# Patient Record
Sex: Male | Born: 1995 | Race: Black or African American | Hispanic: No | Marital: Single | Smoking: Never smoker
Health system: Southern US, Community
[De-identification: ages and names within clinical notes are randomized; demographics above are authoritative.]

## PROBLEM LIST (undated history)

## (undated) HISTORY — PX: HAND SURGERY: SHX662

---
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
## 2019-03-15 ENCOUNTER — Encounter (HOSPITAL_COMMUNITY): Payer: Self-pay

## 2019-03-15 ENCOUNTER — Ambulatory Visit (HOSPITAL_COMMUNITY)
Admission: EM | Admit: 2019-03-15 | Discharge: 2019-03-15 | Disposition: A | Discharge status: Home / Self Care | Payer: Self-pay | Attending: Internal Medicine | Admitting: Internal Medicine

## 2019-03-15 ENCOUNTER — Other Ambulatory Visit: Payer: Self-pay

## 2019-03-15 DIAGNOSIS — R05 Cough: Secondary | ICD-10-CM | POA: Insufficient documentation

## 2019-03-15 DIAGNOSIS — J00 Acute nasopharyngitis [common cold]: Secondary | ICD-10-CM | POA: Insufficient documentation

## 2019-03-15 DIAGNOSIS — Z20828 Contact with and (suspected) exposure to other viral communicable diseases: Secondary | ICD-10-CM | POA: Insufficient documentation

## 2019-03-15 LAB — POCT RAPID STREP A: Streptococcus, Group A Screen (Direct): NEGATIVE

## 2019-03-15 NOTE — ED Provider Notes (Signed)
[facility]    CSN: [id number] Arrival date & time: 03/15/19  0840      History   Chief Complaint Chief Complaint  Patient presents with  . Sore Throat  . Cough    HPI Charles Atkinson is a 23 y.o. male with no past medical history comes to urgent care with complaints of 1-day history of sore throat and nonproductive cough.  Patient attended homecoming party with a large number of people sometime last week.  He denies knowledge of any sick contacts.  He started experiencing symptoms yesterday.  He denies any fever or chills.  No body aches.  He has not tried any over-the-counter medications.  He denies shortness of breath, loss of taste or smell.  No nausea, vomiting or diarrhea  HPI  History reviewed. No pertinent past medical history.  There are no active problems to display for this patient.   Past Surgical History:  Procedure Laterality Date  . HAND SURGERY Right        Home Medications    Prior to Admission medications   Not on File    Family History History reviewed. No pertinent family history.  Social History Social History   Tobacco Use  . Smoking status: Never Smoker  . Smokeless tobacco: Never Used  Substance Use Topics  . Alcohol use: No  . Drug use: No     Allergies   Patient has no known allergies.   Review of Systems Review of Systems  Constitutional: Negative.   HENT: Positive for congestion and sore throat. Negative for ear discharge, ear pain, facial swelling and mouth sores.   Respiratory: Positive for cough. Negative for chest tightness and shortness of breath.   Gastrointestinal: Negative.   Genitourinary: Negative.   Musculoskeletal: Negative.   Neurological: Negative for dizziness, weakness and light-headedness.     Physical Exam Triage Vital Signs ED Triage Vitals  Enc Vitals Group     BP 03/15/19 0912 133/64     Pulse Rate 03/15/19 0912 89     Resp 03/15/19 0912 17     Temp 03/15/19 0912 99.4 F (37.4  C)     Temp Source 03/15/19 0912 Oral     SpO2 03/15/19 0912 99 %     Weight --      Height --      Head Circumference --      Peak Flow --      Pain Score 03/15/19 0911 10     Pain Loc --      Pain Edu? --      Excl. in GC? --    No data found.  Updated Vital Signs BP 133/64   Pulse 89   Temp 99.4 F (37.4 C) (Oral)   Resp 17   SpO2 99%   Visual Acuity Right Eye Distance:   Left Eye Distance:   Bilateral Distance:    Right Eye Near:   Left Eye Near:    Bilateral Near:     Physical Exam Constitutional:      General: He is not in acute distress.    Appearance: He is not ill-appearing.  HENT:     Right Ear: Tympanic membrane normal. No drainage.     Left Ear: Tympanic membrane normal. No drainage.     Mouth/Throat:     Mouth: Mucous membranes are moist. No oral lesions.     Pharynx: Uvula midline. No pharyngeal swelling, oropharyngeal exudate or posterior oropharyngeal erythema.  Tonsils: No tonsillar exudate or tonsillar abscesses. 0 on the right. 0 on the left.  Neck:     Musculoskeletal: Normal range of motion.     Thyroid: No thyromegaly.  Cardiovascular:     Rate and Rhythm: Normal rate and regular rhythm.  Pulmonary:     Effort: Pulmonary effort is normal.     Breath sounds: Normal breath sounds.  Lymphadenopathy:     Cervical: No cervical adenopathy.  Skin:    General: Skin is warm and dry.     Capillary Refill: Capillary refill takes less than 2 seconds.  Neurological:     Mental Status: He is alert.      UC Treatments / Results  Labs (all labs ordered are listed, but only abnormal results are displayed) Labs Reviewed  CULTURE, GROUP A STREP ([place])  NOVEL CORONAVIRUS, NAA (HOSP ORDER, SEND-OUT TO REF LAB; TAT 18-24 HRS)  POCT RAPID STREP A    EKG   Radiology Dg Chest Portable 1 View  Result Date: 03/16/2019 CLINICAL DATA:  Cough, sore throat EXAM: PORTABLE CHEST 1 VIEW COMPARISON:  None. FINDINGS: The heart size and mediastinal  contours are within normal limits. Left costophrenic angle as excluded from the field of view. Lungs are otherwise clear. The visualized skeletal structures are unremarkable. IMPRESSION: No acute cardiopulmonary findings. Electronically Signed   By: Davina Poke M.D.   On: 03/16/2019 14:17    Procedures Procedures (including critical care time)  Medications Ordered in UC Medications - No data to display  Initial Impression / Assessment and Plan / UC Course  I have reviewed the triage vital signs and the nursing notes.  Pertinent labs & imaging results that were available during my care of the patient were reviewed by me and considered in my medical decision making (see chart for details).     1.  Acute illness with sore throat: Rapid strep is negative COVID-19 samples have been taken Patient is advised to self isolate until COVID-19 test results are available Tylenol/Motrin for pain Warm salt water gargle If patient has worsening shortness of breath or other worsening symptoms he is advised to return to urgent care to be reevaluated. Final Clinical Impressions(s) / UC Diagnoses   Final diagnoses:  Acute nasopharyngitis   Discharge Instructions   None    ED Prescriptions    None     PDMP not reviewed this encounter.   Chase Picket, MD 03/16/19 2122

## 2019-03-15 NOTE — ED Triage Notes (Signed)
Pt present to the UC for strep test. Pt states having sore throat and cough x 1day.

## 2019-03-16 ENCOUNTER — Other Ambulatory Visit: Payer: Self-pay

## 2019-03-16 ENCOUNTER — Emergency Department (HOSPITAL_COMMUNITY): Payer: Self-pay

## 2019-03-16 ENCOUNTER — Emergency Department (HOSPITAL_COMMUNITY)
Admission: EM | Admit: 2019-03-16 | Discharge: 2019-03-16 | Disposition: A | Discharge status: Home / Self Care | Payer: Self-pay | Attending: Emergency Medicine | Admitting: Emergency Medicine

## 2019-03-16 DIAGNOSIS — R05 Cough: Secondary | ICD-10-CM | POA: Insufficient documentation

## 2019-03-16 DIAGNOSIS — J020 Streptococcal pharyngitis: Secondary | ICD-10-CM | POA: Insufficient documentation

## 2019-03-16 LAB — COMPREHENSIVE METABOLIC PANEL
ALT: 24 U/L (ref 0–44)
AST: 23 U/L (ref 15–41)
Albumin: 4.1 g/dL (ref 3.5–5.0)
Alkaline Phosphatase: 74 U/L (ref 38–126)
Anion gap: 10 (ref 5–15)
BUN: 10 mg/dL (ref 6–20)
CO2: 25 mmol/L (ref 22–32)
Calcium: 9.6 mg/dL (ref 8.9–10.3)
Chloride: 101 mmol/L (ref 98–111)
Creatinine, Ser: 1.27 mg/dL — ABNORMAL HIGH (ref 0.61–1.24)
GFR calc Af Amer: >60 mL/min (ref >60)
GFR calc non Af Amer: >60 mL/min (ref >60)
Glucose, Bld: 104 mg/dL — ABNORMAL HIGH (ref 70–99)
Potassium: 3.6 mmol/L (ref 3.5–5.1)
Sodium: 136 mmol/L (ref 135–145)
Total Bilirubin: 1 mg/dL (ref 0.3–1.2)
Total Protein: 8 g/dL (ref 6.5–8.1)

## 2019-03-16 LAB — URINALYSIS, ROUTINE W REFLEX MICROSCOPIC
Bilirubin Urine: NEGATIVE
Glucose, UA: NEGATIVE mg/dL
Hgb urine dipstick: NEGATIVE
Ketones, ur: 5 mg/dL — AB
Leukocytes,Ua: NEGATIVE
Nitrite: NEGATIVE
Protein, ur: 30 mg/dL — AB
Specific Gravity, Urine: 1.027 (ref 1.005–1.030)
pH: 6 (ref 5.0–8.0)

## 2019-03-16 LAB — CBC
HCT: 45.5 % (ref 39.0–52.0)
Hemoglobin: 15 g/dL (ref 13.0–17.0)
MCH: 27.5 pg (ref 26.0–34.0)
MCHC: 33 g/dL (ref 30.0–36.0)
MCV: 83.5 fL (ref 80.0–100.0)
Platelets: 231 10*3/uL (ref 150–400)
RBC: 5.45 MIL/uL (ref 4.22–5.81)
RDW: 12.3 % (ref 11.5–15.5)
WBC: 10.1 10*3/uL (ref 4.0–10.5)
nRBC: 0 % (ref 0.0–0.2)

## 2019-03-16 LAB — GROUP A STREP BY PCR: Group A Strep by PCR: DETECTED — AB

## 2019-03-16 LAB — LIPASE, BLOOD: Lipase: 16 U/L (ref 11–51)

## 2019-03-16 LAB — INFLUENZA PANEL BY PCR (TYPE A & B)
Influenza A By PCR: NEGATIVE
Influenza B By PCR: NEGATIVE

## 2019-03-16 MED ORDER — SODIUM CHLORIDE 0.9 % IV BOLUS
1000.0000 mL | Freq: Once | INTRAVENOUS | Status: AC
  Administered 2019-03-16: 1000 mL via INTRAVENOUS

## 2019-03-16 MED ORDER — DEXAMETHASONE SODIUM PHOSPHATE 10 MG/ML IJ SOLN
10.0000 mg | Freq: Once | INTRAMUSCULAR | Status: AC
  Administered 2019-03-16: 10 mg via INTRAVENOUS
  Filled 2019-03-16: qty 1

## 2019-03-16 MED ORDER — ACETAMINOPHEN 325 MG PO TABS
650.0000 mg | ORAL_TABLET | Freq: Once | ORAL | Status: DC
  Filled 2019-03-16: qty 2

## 2019-03-16 MED ORDER — ACETAMINOPHEN 160 MG/5ML PO SOLN
650.0000 mg | Freq: Once | ORAL | Status: AC
  Administered 2019-03-16: 650 mg via ORAL
  Filled 2019-03-16: qty 20.3

## 2019-03-16 MED ORDER — ONDANSETRON HCL 4 MG/2ML IJ SOLN
4.0000 mg | Freq: Once | INTRAMUSCULAR | Status: AC
  Administered 2019-03-16: 4 mg via INTRAVENOUS
  Filled 2019-03-16: qty 2

## 2019-03-16 MED ORDER — PENICILLIN G BENZATHINE & PROC 1200000 UNIT/2ML IM SUSP
1.2000 10*6.[IU] | Freq: Once | INTRAMUSCULAR | Status: AC
  Administered 2019-03-16: 16:00:00 1.2 10*6.[IU] via INTRAMUSCULAR
  Filled 2019-03-16: qty 2

## 2019-03-16 NOTE — ED Triage Notes (Signed)
Pt here for evaluation of sore throat since yesterday, then nausea with one episode of vomiting and chills since last night. Denies sick contacts.

## 2019-03-16 NOTE — Discharge Instructions (Addendum)

## 2019-03-16 NOTE — ED Provider Notes (Signed)
[facility] DEPART[facility] Provider Note   CSN: [id number] Arrival date & time: 03/16/19  1009     History   Chief Complaint Sore throat  HPI Charles Atkinson is a 23 y.o. male presents to emergency department today with chief complaint of sore throat and cough x2 days. He is reporting associated nausea, emesis and chills.  He had one episode of nonbloody nonbilious emesis.  He also has had several episodes of diarrhea today that he describes as loose brown stool.  Denies any blood in stool.  He went to urgent care yesterday and had a strep test and culture that were negative. Patient was also swabbed for Covid and the test is still in process.  Patient is reporting generalized body aches, nonproductive cough.  He has not taken any medications for his symptoms prior to arrival.  He does not have thermometer at home and has been unable to check his temperature but thinks he has had fever and chills.  He denies congestion, ear pain, trouble swallowing, voice change, chest pain, wheezing, shortness of breath, neck pain, urinary symptoms.   No past medical history on file.  There are no active problems to display for this patient.   Past Surgical History:  Procedure Laterality Date  . HAND SURGERY Right         Home Medications    Prior to Admission medications   Not on File    Family History No family history on file.  Social History Social History   Tobacco Use  . Smoking status: Never Smoker  . Smokeless tobacco: Never Used  Substance Use Topics  . Alcohol use: No  . Drug use: No     Allergies   Patient has no known allergies.   Review of Systems Review of Systems  Constitutional: Positive for chills and fever.  HENT: Positive for sore throat. Negative for congestion, ear pain, facial swelling, rhinorrhea, sinus pressure, sinus pain, trouble swallowing and voice change.   Eyes: Negative for pain, redness and itching.   Respiratory: Positive for cough. Negative for shortness of breath and wheezing.   Cardiovascular: Negative for chest pain and palpitations.  Gastrointestinal: Positive for abdominal pain, diarrhea, nausea and vomiting. Negative for blood in stool and constipation.  Genitourinary: Negative for dysuria.  Musculoskeletal: Negative for arthralgias, back pain, myalgias and neck pain.  Skin: Negative for rash and wound.  Neurological: Negative for dizziness, syncope, weakness, numbness and headaches.  Psychiatric/Behavioral: Negative for confusion.     Physical Exam Updated Vital Signs BP 126/81 (BP Location: Left Arm)   Pulse (!) 104   Temp 100 F (37.8 C) (Oral)   Resp 14   SpO2 98%   Physical Exam Vitals signs and nursing note reviewed.  Constitutional:      General: He is not in acute distress.    Appearance: He is ill-appearing. He is not toxic-appearing.  HENT:     Head: Normocephalic and atraumatic.     Comments: No sinus or temporal tenderness.      Right Ear: Tympanic membrane and external ear normal.     Left Ear: Tympanic membrane and external ear normal.     Nose: Nose normal.     Mouth/Throat:     Mouth: Mucous membranes are moist.     Pharynx: Oropharynx is clear.     Comments: Minor erythema to oropharynx, no edema, no exudate, no tonsillar swelling, voice normal, neck supple without lymphadenopathy  Eyes:  General: No scleral icterus.       Right eye: No discharge.        Left eye: No discharge.     Extraocular Movements: Extraocular movements intact.     Conjunctiva/sclera: Conjunctivae normal.     Pupils: Pupils are equal, round, and reactive to light.  Neck:     Musculoskeletal: Normal range of motion.     Vascular: No JVD.  Cardiovascular:     Rate and Rhythm: Normal rate and regular rhythm.     Pulses: Normal pulses.          Radial pulses are 2+ on the right side and 2+ on the left side.     Heart sounds: Normal heart sounds.  Pulmonary:      Comments: Lungs clear to auscultation in all fields. Symmetric chest rise. No wheezing, rales, or rhonchi. Abdominal:     General: Bowel sounds are normal.     Comments: Abdomen is soft, non-distended, and non-tender in all quadrants. No rigidity, no guarding. No peritoneal signs.  Musculoskeletal: Normal range of motion.  Skin:    General: Skin is warm and dry.     Capillary Refill: Capillary refill takes less than 2 seconds.     Findings: No rash.  Neurological:     Mental Status: He is oriented to person, place, and time.     GCS: GCS eye subscore is 4. GCS verbal subscore is 5. GCS motor subscore is 6.     Comments: Fluent speech, no facial droop.  Psychiatric:        Behavior: Behavior normal.      ED Treatments / Results  Labs (all labs ordered are listed, but only abnormal results are displayed) Labs Reviewed  GROUP A STREP BY PCR - Abnormal; Notable for the following components:      Result Value   Group A Strep by PCR DETECTED (*)    All other components within normal limits  COMPREHENSIVE METABOLIC PANEL - Abnormal; Notable for the following components:   Glucose, Bld 104 (*)    Creatinine, Ser 1.27 (*)    All other components within normal limits  URINALYSIS, ROUTINE W REFLEX MICROSCOPIC - Abnormal; Notable for the following components:   Ketones, ur 5 (*)    Protein, ur 30 (*)    Bacteria, UA RARE (*)    All other components within normal limits  LIPASE, BLOOD  CBC  INFLUENZA PANEL BY PCR (TYPE A & B)    EKG None  Radiology Dg Chest Portable 1 View  Result Date: 03/16/2019 CLINICAL DATA:  Cough, sore throat EXAM: PORTABLE CHEST 1 VIEW COMPARISON:  None. FINDINGS: The heart size and mediastinal contours are within normal limits. Left costophrenic angle as excluded from the field of view. Lungs are otherwise clear. The visualized skeletal structures are unremarkable. IMPRESSION: No acute cardiopulmonary findings. Electronically Signed   By: Duanne GuessNicholas  Plundo  M.D.   On: 03/16/2019 14:17    Procedures Procedures (including critical care time)  Medications Ordered in ED Medications  penicillin g procaine-penicillin g benzathine (BICILLIN-CR) injection 600000-600000 units (has no administration in time range)  sodium chloride 0.9 % bolus 1,000 mL (1,000 mLs Intravenous New Bag/Given 03/16/19 1354)  dexamethasone (DECADRON) injection 10 mg (10 mg Intravenous Given 03/16/19 1355)  ondansetron (ZOFRAN) injection 4 mg (4 mg Intravenous Given 03/16/19 1355)  acetaminophen (TYLENOL) 160 MG/5ML solution 650 mg (650 mg Oral Given 03/16/19 1410)     Initial Impression / Assessment and Plan /  ED Course  I have reviewed the triage vital signs and the nursing notes.  Pertinent labs & imaging results that were available during my care of the patient were reviewed by me and considered in my medical decision making (see chart for details).  Patient seen and examined. Patient is ill-appearing however is in no acute distress and is nontoxic in appearance.  He is febrile to 100 and tachycardic to 104, no hypoxia.  On exam his lungs are clear to auscultation all fields.  He has generalized abdominal tenderness peritoneal signs.  He does have erythema to posterior oropharynx without exudate.  I viewed his urgent care lab results and his strep test and culture were negative.  His Covid test is still in process.  Labs today with no leukocytosis, no anemia, lipase is within normal range, slight elevation in creatinine at 1.27.Marland Kitchen  UA without signs of infection. Flu test is negative. Chest xray viewed by me without signs of infiltrate, unlikely pneumonia. IVF, Tylenol, Decadron, Zofran and on reassessment patient reports pain has improved.  Discussed with patient his exam is suggestive of strep throat.  Even though he had a negative test I feel it is reasonable to swab him again today.  He agrees with this plan.  Strep today is positive.  Pt treated with IM penicillin while  in the ED. Pt does not appear dehydrated, but did discuss importance of water rehydration.  Encouraged at home supportive care measures. Patient successfully fluid challenged in the ED without difficulty swallowing.  Strict return precautions given. NAD. VSS, tachycardia resolved.  Recommended PCP follow up for re-evaluation.   IVY MERIWETHER was evaluated in Emergency Department on 03/16/2019 for the symptoms described in the history of present illness. He was evaluated in the context of the global COVID-19 pandemic, which necessitated consideration that the patient might be at risk for infection with the SARS-CoV-2 virus that causes COVID-19. Institutional protocols and algorithms that pertain to the evaluation of patients at risk for COVID-19 are in a state of rapid change based on information released by regulatory bodies including the CDC and federal and state organizations. These policies and algorithms were followed during the patient's care in the ED.   Portions of this note were generated with Scientist, clinical (histocompatibility and immunogenetics). Dictation errors may occur despite best attempts at proofreading.  Final Clinical Impressions(s) / ED Diagnoses   Final diagnoses:  Strep pharyngitis    ED Discharge Orders    None       Sherene Sires, PA-C 03/16/19 1617    Tegeler, Canary Brim, MD 03/16/19 1630

## 2019-03-17 LAB — CULTURE, GROUP A STREP (THRC)

## 2019-03-17 LAB — NOVEL CORONAVIRUS, NAA (HOSP ORDER, SEND-OUT TO REF LAB; TAT 18-24 HRS): SARS-CoV-2, NAA: NOT DETECTED

## 2019-05-28 ENCOUNTER — Emergency Department (HOSPITAL_COMMUNITY)
Admission: EM | Admit: 2019-05-28 | Discharge: 2019-05-28 | Disposition: A | Discharge status: Home / Self Care | Payer: Medicaid Other | Attending: Emergency Medicine | Admitting: Emergency Medicine

## 2019-05-28 ENCOUNTER — Encounter (HOSPITAL_COMMUNITY): Payer: Self-pay | Admitting: Emergency Medicine

## 2019-05-28 DIAGNOSIS — J020 Streptococcal pharyngitis: Secondary | ICD-10-CM

## 2019-05-28 LAB — GROUP A STREP BY PCR: Group A Strep by PCR: DETECTED — AB

## 2019-05-28 MED ORDER — PENICILLIN G BENZATHINE 1200000 UNIT/2ML IM SUSP
1.2000 10*6.[IU] | Freq: Once | INTRAMUSCULAR | Status: AC
  Administered 2019-05-28: 16:00:00 1.2 10*6.[IU] via INTRAMUSCULAR
  Filled 2019-05-28: qty 2

## 2019-05-28 MED ORDER — DEXAMETHASONE 4 MG PO TABS
6.0000 mg | ORAL_TABLET | Freq: Once | ORAL | Status: AC
  Administered 2019-05-28: 6 mg via ORAL
  Filled 2019-05-28: qty 2

## 2019-05-28 NOTE — ED Provider Notes (Signed)
[facility] DEPARTMENT Provider Note   CSN: [id number] Arrival date & time: 05/28/19  1419     History Chief Complaint  Patient presents with  . Sore Throat    Charles Atkinson is a 24 y.o. male.  24 y.o male with no PMH presents to the ED with a chief complaint of sore throat x 4 days. Patient describes as a sharp sensation and burning of his throat worse with swallowing and better with liquids. He has taken tylenol for his symptoms without improvement. He also reports his 46 year old niece has been diagnosed with strep pharyngitis recently. He reports he also is prone to strep pharyngitis infection yearly, mainly around this time.  He is currently tolerating grits and solids some discomfort.  He denies any fever, shortness of breath, difficulty swallowing, changes in voice.  The history is provided by the patient and medical records.       History reviewed. No pertinent past medical history.  There are no problems to display for this patient.   Past Surgical History:  Procedure Laterality Date  . HAND SURGERY Right        No family history on file.  Social History   Tobacco Use  . Smoking status: Never Smoker  . Smokeless tobacco: Never Used  Substance Use Topics  . Alcohol use: No  . Drug use: No    Home Medications Prior to Admission medications   Not on File    Allergies    Patient has no known allergies.  Review of Systems   Review of Systems  Constitutional: Negative for fever.  HENT: Positive for sore throat. Negative for facial swelling, postnasal drip and rhinorrhea.     Physical Exam Updated Vital Signs BP 136/71 (BP Location: Left Arm)   Pulse 68   Temp 98.8 F (37.1 C) (Oral)   Resp 16   SpO2 99%   Physical Exam Vitals and nursing note reviewed.  Constitutional:      Appearance: He is well-developed.  HENT:     Head: Normocephalic and atraumatic.     Mouth/Throat:     Mouth: Mucous membranes are  moist.     Palate: No mass.     Pharynx: Uvula midline. Oropharyngeal exudate and posterior oropharyngeal erythema present. No pharyngeal swelling.     Tonsils: Tonsillar exudate present. 2+ on the right. 2+ on the left.     Comments: BL tonsilar exudates, with mild swelling BL.  Cardiovascular:     Rate and Rhythm: Normal rate.  Pulmonary:     Effort: Pulmonary effort is normal.  Abdominal:     Palpations: Abdomen is soft.  Musculoskeletal:     Cervical back: Normal range of motion and neck supple.  Skin:    General: Skin is warm and dry.  Neurological:     Mental Status: He is alert and oriented to person, place, and time.     ED Results / Procedures / Treatments   Labs (all labs ordered are listed, but only abnormal results are displayed) Labs Reviewed  GROUP A STREP BY PCR - Abnormal; Notable for the following components:      Result Value   Group A Strep by PCR DETECTED (*)    All other components within normal limits    EKG None  Radiology No results found.  Procedures Procedures (including critical care time)  Medications Ordered in ED Medications  penicillin g benzathine (BICILLIN LA) 1200000 UNIT/2ML injection 1.2 Million Units (  has no administration in time range)  dexamethasone (DECADRON) tablet 6 mg (6 mg Oral Given 05/28/19 1502)    ED Course  I have reviewed the triage vital signs and the nursing notes.  Pertinent labs & imaging results that were available during my care of the patient were reviewed by me and considered in my medical decision making (see chart for details).  Clinical Course as of May 27 1534  Fri May 28, 2019  1536 Group A Strep by PCR(!): DETECTED [JS]    Clinical Course User Index [JS] Claude Manges, PA-C   MDM Rules/Calculators/A&P    Patient with no pertinent past medical history presents to the ED with complaints of sore throat for the past 4 days.  He reports his niece was recently diagnosed with strep pharyngitis.  Of  note, patient does report he does deal with strep throat on a yearly basis around this time.  He is try over-the-counter medication without improvement in symptoms.  During my evaluation oropharynx appears swollen, bilateral exudates are present to tonsil area.  No PTA.  He is able to rate his secretions without any distress.  Vitals are within normal limits.  He is currently drinking soda while in the room tolerating this as well.  Will obtain PCR swab along with provide patient with 1 tablet of Decadron to help with swelling.  Strep pharyngitis was detected group A positive.  Patient will be treated with Bicillin 1.2, he was also given Decadron for help with additional swelling.  Patient understands and agrees with management, will follow up with PCP as needed.   Portions of this note were generated with Scientist, clinical (histocompatibility and immunogenetics). Dictation errors may occur despite best attempts at proofreading.  Final Clinical Impression(s) / ED Diagnoses Final diagnoses:  Strep pharyngitis    Rx / DC Orders ED Discharge Orders    None       Claude Manges, PA-C 05/28/19 1551    Jacalyn Lefevre, MD 05/31/19 [phone]

## 2019-05-28 NOTE — ED Triage Notes (Signed)
Pt has sore throat for the last 2 days worse today. Denies any cough, sob or fever. Pt states his niece has strep.

## 2019-05-28 NOTE — Discharge Instructions (Addendum)
Today you were treated for strep pharyngitis with an injection of Bicillin.  You were also provided with steroids to help with swelling, your symptoms should improve after a couple of days.  Aloe up with your primary care physician as needed.

## 2019-05-28 NOTE — ED Notes (Signed)
Patient verbalizes understanding of discharge instructions. Opportunity for questioning and answers were provided. Armband removed by staff, pt discharged from ED ambulatory to home.  

## 2019-07-01 ENCOUNTER — Other Ambulatory Visit: Payer: Self-pay

## 2019-07-01 ENCOUNTER — Emergency Department (HOSPITAL_COMMUNITY)
Admission: EM | Admit: 2019-07-01 | Discharge: 2019-07-01 | Disposition: A | Discharge status: Home / Self Care | Payer: Medicaid Other | Attending: Emergency Medicine | Admitting: Emergency Medicine

## 2019-07-01 DIAGNOSIS — M7021 Olecranon bursitis, right elbow: Secondary | ICD-10-CM

## 2019-07-01 DIAGNOSIS — Y939 Activity, unspecified: Secondary | ICD-10-CM | POA: Insufficient documentation

## 2019-07-01 MED ORDER — SULFAMETHOXAZOLE-TRIMETHOPRIM 800-160 MG PO TABS: 1.0000 | ORAL_TABLET | Freq: Two times a day (BID) | ORAL | 0 refills | Status: AC

## 2019-07-01 NOTE — ED Provider Notes (Signed)
[facility] DEPART[facility] Provider Note   CSN: [id number] Arrival date & time: 07/01/19  1129     History No chief complaint on file.   Charles Atkinson is a 24 y.o. male.  HPI     24 year old male presents with a 3-day history of right elbow pain.  Patient states pain to the posterior aspect of his right elbow.  He denies pain with range of motion, numbness, tingling.  He denies any direct injury or trauma.  He notes some mild redness.  He denies any fevers, chills, nausea, vomiting.   No past medical history on file.  There are no problems to display for this patient.   Past Surgical History:  Procedure Laterality Date  . HAND SURGERY Right        No family history on file.  Social History   Tobacco Use  . Smoking status: Never Smoker  . Smokeless tobacco: Never Used  Substance Use Topics  . Alcohol use: No  . Drug use: No    Home Medications Prior to Admission medications   Not on File    Allergies    Patient has no known allergies.  Review of Systems   Review of Systems  Constitutional: Negative for chills and fever.  Respiratory: Negative for shortness of breath.   Cardiovascular: Negative for chest pain.  Gastrointestinal: Negative for abdominal pain, nausea and vomiting.  Musculoskeletal: Positive for arthralgias and joint swelling.  Skin: Positive for wound.    Physical Exam Updated Vital Signs BP 132/72 (BP Location: Left Arm)   Pulse 63   Temp 97.7 F (36.5 C) (Oral)   Resp 16   SpO2 96%   Physical Exam Vitals and nursing note reviewed.  Constitutional:      Appearance: He is well-developed.  HENT:     Head: Normocephalic and atraumatic.  Eyes:     Conjunctiva/sclera: Conjunctivae normal.  Pulmonary:     Effort: Pulmonary effort is normal. No respiratory distress.  Abdominal:     General: Bowel sounds are normal. There is no distension.     Palpations: Abdomen is soft.     Tenderness: There is no  abdominal tenderness.  Musculoskeletal:        General: No deformity. Normal range of motion.     Right elbow: Swelling present. Normal range of motion. Tenderness present in olecranon process.     Left elbow: Normal. No swelling or effusion. Normal range of motion. No tenderness.     Cervical back: Neck supple.     Comments: There is a pinpoint white spot in the middle of the olecranon however no defined abscess  Skin:    General: Skin is warm and dry.     Findings: No erythema or rash.  Neurological:     Mental Status: He is alert and oriented to person, place, and time.  Psychiatric:        Behavior: Behavior normal.     ED Results / Procedures / Treatments   Labs (all labs ordered are listed, but only abnormal results are displayed) Labs Reviewed - No data to display  EKG None  Radiology No results found.  Procedures Procedures (including critical care time)  Medications Ordered in ED Medications - No data to display  ED Course  I have reviewed the triage vital signs and the nursing notes.  Pertinent labs & imaging results that were available during my care of the patient were reviewed by me and considered in my  medical decision making (see chart for details).    MDM Rules/Calculators/A&P                      Patient presented with pain and swelling over the right olecranon process.  History and physical consistent with infectious bursitis.  There is some surrounding erythema approximately 2 inches circumferentially.  No palpable defined abscess.  Patient has full range of motion of right elbow, neurovascularly intact distally.  No ascending lymphangitis.  No circumferential erythema.  Called and discussed case with Earney Hamburg who was agreeable with Bactrim and follow-up outpatient.  Area of redness marked with a skin marker.  Patient given return precautions.  He is agreeable with plan, ready and stable for discharge.   At this time there does not appear to be  any evidence of an acute emergency medical condition and the patient appears stable for discharge with appropriate outpatient follow up.Diagnosis was discussed with patient who verbalizes understanding and is agreeable to discharge. Pt case discussed with Dr. Rubin Payor who agrees with my plan.    Final Clinical Impression(s) / ED Diagnoses Final diagnoses:  None    Rx / DC Orders ED Discharge Orders    None       Rueben Bash 07/01/19 2221    Benjiman Core, MD 07/02/19 [phone]

## 2019-07-01 NOTE — Discharge Instructions (Addendum)
Take Bactrim as prescribed.  Follow-up with orthopedics within 2 to 3 days for continued evaluation.  Return immediately for new or worsening symptoms or concerns, such as worsening redness, worsening swelling, worsening pain, fevers, vomiting or any concerns at all.

## 2019-07-01 NOTE — ED Triage Notes (Signed)
Pt thinks he got a spider bite on his R elbow. Pain began three days ago and today he noticed a white head developing at the center of the redness.

## 2020-02-24 ENCOUNTER — Other Ambulatory Visit: Payer: Self-pay

## 2020-03-28 ENCOUNTER — Other Ambulatory Visit: Payer: Self-pay

## 2020-03-28 ENCOUNTER — Encounter (HOSPITAL_COMMUNITY): Payer: Self-pay | Admitting: Emergency Medicine

## 2020-03-28 ENCOUNTER — Emergency Department (HOSPITAL_COMMUNITY)
Admission: EM | Admit: 2020-03-28 | Discharge: 2020-03-28 | Disposition: A | Discharge status: Home / Self Care | Payer: Medicaid Other | Attending: Emergency Medicine | Admitting: Emergency Medicine

## 2020-03-28 DIAGNOSIS — R509 Fever, unspecified: Secondary | ICD-10-CM | POA: Insufficient documentation

## 2020-03-28 DIAGNOSIS — J039 Acute tonsillitis, unspecified: Secondary | ICD-10-CM

## 2020-03-28 DIAGNOSIS — R1310 Dysphagia, unspecified: Secondary | ICD-10-CM | POA: Insufficient documentation

## 2020-03-28 DIAGNOSIS — Z20822 Contact with and (suspected) exposure to covid-19: Secondary | ICD-10-CM | POA: Insufficient documentation

## 2020-03-28 DIAGNOSIS — R070 Pain in throat: Secondary | ICD-10-CM | POA: Insufficient documentation

## 2020-03-28 LAB — RESP PANEL BY RT-PCR (FLU A&B, COVID) ARPGX2
Influenza A by PCR: NEGATIVE
Influenza B by PCR: NEGATIVE
SARS Coronavirus 2 by RT PCR: NEGATIVE

## 2020-03-28 MED ORDER — AMOXICILLIN 500 MG PO CAPS: 500.0000 mg | ORAL_CAPSULE | Freq: Three times a day (TID) | ORAL | 0 refills | Status: DC

## 2020-03-28 NOTE — Discharge Instructions (Signed)
Contact a health care provider if: You notice large, tender lumps in your neck that were not there before. You have a fever that does not go away after 2-3 days. You develop a rash. You cough up a green, yellow-brown, or bloody substance. You cannot swallow liquids or food for 24 hours. Only one of your tonsils is swollen. Get help right away if: You develop any new symptoms, such as vomiting, severe headache, stiff neck, chest pain, trouble breathing, or trouble swallowing. You have severe throat pain along with drooling or voice changes. You have severe pain that is not controlled with medicines. You cannot fully open your mouth. You develop redness, swelling, or severe pain anywhere in your neck. 

## 2020-03-28 NOTE — ED Triage Notes (Signed)
Pt arrives to ED with c/o sore throat x2 days. No other associated symptoms.

## 2020-03-28 NOTE — ED Provider Notes (Signed)
[facility] EMERGENCY DEPARTMENT Provider Note   CSN: [id number] Arrival date & time: 03/28/20  1525     History Chief Complaint  Patient presents with  . Sore Throat    Charles Atkinson is a 24 y.o. male who presents emergency department with a chief complaint of sore throat.  Patient states that he gets strep throat every year and that this feels exactly the same.  He has had 3 days of worsening sore throat, pain with swallowing, fever, chills.  He denies cough, loss of sense of taste or smell or Covid exposure.  HPI     History reviewed. No pertinent past medical history.  There are no problems to display for this patient.   Past Surgical History:  Procedure Laterality Date  . HAND SURGERY Right        History reviewed. No pertinent family history.  Social History   Tobacco Use  . Smoking status: Never Smoker  . Smokeless tobacco: Never Used  Substance Use Topics  . Alcohol use: No  . Drug use: No    Home Medications Prior to Admission medications   Not on File    Allergies    Patient has no known allergies.  Review of Systems   Review of Systems Ten systems reviewed and are negative for acute change, except as noted in the HPI.   Physical Exam Updated Vital Signs BP 132/67   Pulse 66   Temp 98.6 F (37 C) (Oral)   Resp 14   Ht 6\' 2"  (1.88 m)   Wt 77.1 kg   SpO2 99%   BMI 21.83 kg/m   Physical Exam Physical Exam  Nursing note and vitals reviewed. Constitutional: He appears well-developed and well-nourished. No distress.  HENT:  Head: Normocephalic and atraumatic.  Eyes: Conjunctivae normal are normal. No scleral icterus.  Neck: Normal range of motion. Neck supple.  Positive for tonsillar adenopathy Mouth: Bilateral tonsillar hypertrophy with exudates, erythema, uvula midline without swelling.  Cardiovascular: Normal rate, regular rhythm and normal heart sounds.   Pulmonary/Chest: Effort normal and breath sounds  normal. No respiratory distress.  Abdominal: Soft. There is no tenderness.  Musculoskeletal: He exhibits no edema.  Neurological: He is alert.  Skin: Skin is warm and dry. He is not diaphoretic.  Psychiatric: His behavior is normal.    ED Results / Procedures / Treatments   Labs (all labs ordered are listed, but only abnormal results are displayed) Labs Reviewed - No data to display  EKG None  Radiology No results found.  Procedures Procedures (including critical care time)  Medications Ordered in ED Medications - No data to display  ED Course  I have reviewed the triage vital signs and the nursing notes.  Pertinent labs & imaging results that were available during my care of the patient were reviewed by me and considered in my medical decision making (see chart for details).    MDM Rules/Calculators/A&P                          24 year old male here with complaint of sore throat, history of recurrent strep throat.  Patient will be discharged with prescription for amoxicillin.  Covid swab obtained.  He is otherwise tolerating his own secretions, afebrile and hemodynamically stable.  Gust return precautions.  DELMOS Atkinson was evaluated in Emergency Department on 03/28/2020 for the symptoms described in the history of present illness. He was evaluated in the context of  the global COVID-19 pandemic, which necessitated consideration that the patient might be at risk for infection with the SARS-CoV-2 virus that causes COVID-19. Institutional protocols and algorithms that pertain to the evaluation of patients at risk for COVID-19 are in a state of rapid change based on information released by regulatory bodies including the CDC and federal and state organizations. These policies and algorithms were followed during the patient's care in the ED.  Final Clinical Impression(s) / ED Diagnoses Final diagnoses:  None    Rx / DC Orders ED Discharge Orders    None       Arthor Captain, PA-C 03/28/20 1714    Charlynne Pander, MD 03/28/20 2226

## 2020-11-14 ENCOUNTER — Encounter (HOSPITAL_COMMUNITY): Payer: Self-pay

## 2020-11-14 ENCOUNTER — Emergency Department (HOSPITAL_COMMUNITY)
Admission: EM | Admit: 2020-11-14 | Discharge: 2020-11-14 | Disposition: A | Discharge status: Home / Self Care | Payer: Medicaid Other | Attending: Emergency Medicine | Admitting: Emergency Medicine

## 2020-11-14 ENCOUNTER — Emergency Department (HOSPITAL_COMMUNITY): Payer: Medicaid Other

## 2020-11-14 DIAGNOSIS — Z20822 Contact with and (suspected) exposure to covid-19: Secondary | ICD-10-CM | POA: Insufficient documentation

## 2020-11-14 DIAGNOSIS — R059 Cough, unspecified: Secondary | ICD-10-CM

## 2020-11-14 DIAGNOSIS — B349 Viral infection, unspecified: Secondary | ICD-10-CM | POA: Insufficient documentation

## 2020-11-14 LAB — POC SARS CORONAVIRUS 2 AG -  ED: SARSCOV2ONAVIRUS 2 AG: NEGATIVE

## 2020-11-14 LAB — SARS CORONAVIRUS 2 (TAT 6-24 HRS): SARS Coronavirus 2: NEGATIVE

## 2020-11-14 MED ORDER — IBUPROFEN 400 MG PO TABS
600.0000 mg | ORAL_TABLET | Freq: Once | ORAL | Status: AC
  Administered 2020-11-14: 600 mg via ORAL
  Filled 2020-11-14: qty 1

## 2020-11-14 MED ORDER — KETOROLAC TROMETHAMINE 30 MG/ML IJ SOLN
30.0000 mg | Freq: Once | INTRAMUSCULAR | Status: AC
  Administered 2020-11-14: 30 mg via INTRAVENOUS
  Filled 2020-11-14: qty 1

## 2020-11-14 MED ORDER — ONDANSETRON 4 MG PO TBDP
4.0000 mg | ORAL_TABLET | Freq: Once | ORAL | Status: AC
  Administered 2020-11-14: 4 mg via ORAL
  Filled 2020-11-14: qty 1

## 2020-11-14 MED ORDER — IBUPROFEN 800 MG PO TABS: 800.0000 mg | ORAL_TABLET | Freq: Three times a day (TID) | ORAL | 0 refills | Status: DC | PRN

## 2020-11-14 NOTE — ED Triage Notes (Signed)
Pt comes via [facility] for a fever, and body aches that started today, took tylenol without relief

## 2020-11-14 NOTE — Discharge Instructions (Signed)
Drink plenty of fluids.  Take Tylenol for fever.  Follow-up in 2 to 3 days if not getting any better

## 2020-11-14 NOTE — ED Provider Notes (Signed)
[facility] EMERGENCY DEPARTMENT Provider Note   CSN: [id number] Arrival date & time: 11/14/20  6606     History Chief Complaint  Patient presents with   Fever    Charles Atkinson is a 25 y.o. male.  Patient complains of fevers and chills and aches.  He has also had a cough for a few days  The history is provided by the patient and medical records. No language interpreter was used.  Fever Temp source:  Subjective Severity:  Moderate Onset quality:  Sudden Duration:  1 day Timing:  Constant Progression:  Waxing and waning Chronicity:  New Relieved by:  Nothing Worsened by:  Nothing Ineffective treatments:  None tried Associated symptoms: cough   Associated symptoms: no chest pain, no chills, no dysuria, no ear pain, no rash, no sore throat and no vomiting       History reviewed. No pertinent past medical history.  There are no problems to display for this patient.   Past Surgical History:  Procedure Laterality Date   HAND SURGERY Right        No family history on file.  Social History   Tobacco Use   Smoking status: Never   Smokeless tobacco: Never  Substance Use Topics   Alcohol use: No   Drug use: No    Home Medications Prior to Admission medications   Medication Sig Start Date End Date Taking? Authorizing Provider  ibuprofen (ADVIL) 800 MG tablet Take 1 tablet (800 mg total) by mouth every 8 (eight) hours as needed for moderate pain. 11/14/20  Yes Bethann Berkshire, MD  amoxicillin (AMOXIL) 500 MG capsule Take 1 capsule (500 mg total) by mouth 3 (three) times daily. 03/28/20   Arthor Captain, PA-C    Allergies    Patient has no known allergies.  Review of Systems   Review of Systems  Constitutional:  Positive for fever. Negative for chills.  HENT:  Negative for ear pain and sore throat.   Eyes:  Negative for pain and visual disturbance.  Respiratory:  Positive for cough. Negative for shortness of breath.   Cardiovascular:   Negative for chest pain and palpitations.  Gastrointestinal:  Negative for abdominal pain and vomiting.  Genitourinary:  Negative for dysuria and hematuria.  Musculoskeletal:  Negative for arthralgias and back pain.  Skin:  Negative for color change and rash.  Neurological:  Negative for seizures and syncope.  All other systems reviewed and are negative.  Physical Exam Updated Vital Signs BP 128/65 (BP Location: Right Arm)   Pulse 72   Temp 98.6 F (37 C) (Oral)   Resp 16   SpO2 98%   Physical Exam Vitals and nursing note reviewed.  Constitutional:      Appearance: He is well-developed.  HENT:     Head: Normocephalic.     Nose: Nose normal.  Eyes:     General: No scleral icterus.    Conjunctiva/sclera: Conjunctivae normal.  Neck:     Thyroid: No thyromegaly.  Cardiovascular:     Rate and Rhythm: Normal rate and regular rhythm.     Heart sounds: No murmur heard.   No friction rub. No gallop.  Pulmonary:     Breath sounds: No stridor. No wheezing or rales.  Chest:     Chest wall: No tenderness.  Abdominal:     General: There is no distension.     Tenderness: There is no abdominal tenderness. There is no rebound.  Musculoskeletal:  General: Normal range of motion.     Cervical back: Neck supple.  Lymphadenopathy:     Cervical: No cervical adenopathy.  Skin:    Findings: No erythema or rash.  Neurological:     Mental Status: He is alert and oriented to person, place, and time.     Motor: No abnormal muscle tone.     Coordination: Coordination normal.  Psychiatric:        Behavior: Behavior normal.    ED Results / Procedures / Treatments   Labs (all labs ordered are listed, but only abnormal results are displayed) Labs Reviewed  SARS CORONAVIRUS 2 (TAT 6-24 HRS)  POC SARS CORONAVIRUS 2 AG -  ED    EKG None  Radiology DG Chest Port 1 View  Result Date: 11/14/2020 CLINICAL DATA:  Fever and body aches. EXAM: PORTABLE CHEST 1 VIEW COMPARISON:   03/16/2019. FINDINGS: The heart size and mediastinal contours are within normal limits. Both lungs are clear. The visualized skeletal structures are unremarkable. IMPRESSION: No acute cardiopulmonary disease. Electronically Signed   By: Maisie Fus  Register   On: 11/14/2020 05:53    Procedures Procedures   Medications Ordered in ED Medications  ketorolac (TORADOL) 30 MG/ML injection 30 mg (has no administration in time range)  ibuprofen (ADVIL) tablet 600 mg (600 mg Oral Given 11/14/20 0540)  ondansetron (ZOFRAN-ODT) disintegrating tablet 4 mg (4 mg Oral Given 11/14/20 0540)    ED Course  I have reviewed the triage vital signs and the nursing notes.  Pertinent labs & imaging results that were available during my care of the patient were reviewed by me and considered in my medical decision making (see chart for details).    MDM Rules/Calculators/A&P                          Patient with viral syndrome.  Point-of-care COVID was negative.  We will repeat with a 24-hour COVID test and give him Motrin and told to follow-up as needed Final Clinical Impression(s) / ED Diagnoses Final diagnoses:  Viral illness    Rx / DC Orders ED Discharge Orders          Ordered    ibuprofen (ADVIL) 800 MG tablet  Every 8 hours PRN        11/14/20 1116             Bethann Berkshire, MD 11/14/20 1120

## 2020-11-14 NOTE — ED Provider Notes (Signed)
Emergency Medicine Provider Triage Evaluation Note  Charles Atkinson , a 25 y.o. male  was evaluated in triage.  Pt complains of fever since yesterday.  Pt reports associated cough, nasal congestion, sore throat, body aches, nausea.  Is not vaccinated for COVID.  Patient reports nausea without vomiting or diarrhea.  Also concerned about possible bursitis of the right elbow.  Reports he has been using Coban to create pressure.  Denies that it is open or draining.  Review of Systems  Positive: Fever, cough, nasal congestion, sore throat, body aches, nausea Negative: Vomiting, diarrhea  Physical Exam  BP 132/73   Pulse (!) 101   Temp (!) 102.5 F (39.2 C) (Oral)   Resp 15   SpO2 96%   Gen:   Awake, no distress   Resp:  Normal effort, clear and equal breath sounds MSK:   Moves extremities without difficulty, small area of erythema to the right elbow - unclear if this is secondary to the very tight bandage that was removed. Other:  Abd soft, nondistended  Medical Decision Making  Medically screening exam initiated at 6:21 AM.  Appropriate orders placed.  Charles Atkinson was informed that the remainder of the evaluation will be completed by another provider, this initial triage assessment does not replace that evaluation, and the importance of remaining in the ED until their evaluation is complete.  Fever - suspect COVID.  CXR and COVID test pending.   Curley Fayette, Boyd Kerbs 11/14/20 7948    Nira Conn, MD 11/15/20 0230

## 2021-08-29 ENCOUNTER — Ambulatory Visit (HOSPITAL_COMMUNITY)
Admission: EM | Admit: 2021-08-29 | Discharge: 2021-08-29 | Disposition: A | Discharge status: Home / Self Care | Payer: 59 | Attending: Family Medicine | Admitting: Family Medicine

## 2021-08-29 ENCOUNTER — Encounter (HOSPITAL_COMMUNITY): Payer: Self-pay | Admitting: Emergency Medicine

## 2021-08-29 DIAGNOSIS — K069 Disorder of gingiva and edentulous alveolar ridge, unspecified: Secondary | ICD-10-CM | POA: Diagnosis not present

## 2021-08-29 DIAGNOSIS — K0889 Other specified disorders of teeth and supporting structures: Secondary | ICD-10-CM | POA: Diagnosis not present

## 2021-08-29 DIAGNOSIS — R220 Localized swelling, mass and lump, head: Secondary | ICD-10-CM

## 2021-08-29 MED ORDER — AMOXICILLIN 500 MG PO CAPS: 500.0000 mg | ORAL_CAPSULE | Freq: Three times a day (TID) | ORAL | 0 refills | Status: AC

## 2021-08-29 NOTE — Discharge Instructions (Addendum)
You were seen today for dental pain due to infection.  I have sent out an antibiotic to help with pain, swelling and infection.  You can take over the counter motrin/advil/aleve for pain.  ?You should follow up with a dentist for further care.  I have provided this list to you today.  ?

## 2021-08-29 NOTE — ED Triage Notes (Signed)
Pt c/o right sided dental swelling for a few days that is causing pain ?

## 2021-08-29 NOTE — ED Provider Notes (Signed)
?[facility] ? ? ? ?CSN: [id number] ?Arrival date & time: 08/29/21  1425 ? ? ?  ? ?History   ?Chief Complaint ?Chief Complaint  ?Patient presents with  ? Oral Swelling  ? ? ?HPI ?Charles Atkinson is a 26 y.o. male.  ? ?Patient is here for 3 days of right sided oral pain and swelling.  ?Painful to eat/chew.  He does have a bad tooth in that location.  ?No fevers/chills.   ?No meds taken for pain.  ? ?History reviewed. No pertinent past medical history. ? ?There are no problems to display for this patient. ? ? ?Past Surgical History:  ?Procedure Laterality Date  ? HAND SURGERY Right   ? ? ? ? ? ?Home Medications   ? ?Prior to Admission medications   ?Medication Sig Start Date End Date Taking? Authorizing Provider  ?amoxicillin (AMOXIL) 500 MG capsule Take 1 capsule (500 mg total) by mouth 3 (three) times daily. 03/28/20   Arthor Captain, PA-C  ?ibuprofen (ADVIL) 800 MG tablet Take 1 tablet (800 mg total) by mouth every 8 (eight) hours as needed for moderate pain. 11/14/20   Bethann Berkshire, MD  ? ? ?Family History ?No family history on file. ? ?Social History ?Social History  ? ?Tobacco Use  ? Smoking status: Never  ? Smokeless tobacco: Never  ?Substance Use Topics  ? Alcohol use: No  ? Drug use: No  ? ? ? ?Allergies   ?Patient has no known allergies. ? ? ?Review of Systems ?Review of Systems  ?Constitutional: Negative.   ?Respiratory: Negative.    ?Cardiovascular: Negative.   ?Gastrointestinal: Negative.   ?Genitourinary: Negative.   ? ? ?Physical Exam ?Triage Vital Signs ?ED Triage Vitals [08/29/21 1435]  ?Enc Vitals Group  ?   BP 117/82  ?   Pulse Rate 71  ?   Resp 14  ?   Temp 98.2 ?F (36.8 ?C)  ?   Temp Source Oral  ?   SpO2 97 %  ?   Weight   ?   Height   ?   Head Circumference   ?   Peak Flow   ?   Pain Score   ?   Pain Loc   ?   Pain Edu?   ?   Excl. in [facility]?   ? ?No data found. ? ?Updated Vital Signs ?BP 117/82 (BP Location: Right Arm)   Pulse 71   Temp 98.2 ?F (36.8 ?C) (Oral)   Resp 14   SpO2 97%   ? ?Visual Acuity ?Right Eye Distance:   ?Left Eye Distance:   ?Bilateral Distance:   ? ?Right Eye Near:   ?Left Eye Near:    ?Bilateral Near:    ? ?Physical Exam ?HENT:  ?   Head: Normocephalic.  ?   Mouth/Throat:  ?   Comments: Tooth at right upper jaw that is rotted;  ttp to the gum surrounding this tooth ?Musculoskeletal:  ?   Cervical back: Normal range of motion.  ?Neurological:  ?   Mental Status: He is alert.  ? ? ? ?UC Treatments / Results  ?Labs ?(all labs ordered are listed, but only abnormal results are displayed) ?Labs Reviewed - No data to display ? ?EKG ? ? ?Radiology ?No results found. ? ?Procedures ?Procedures (including critical care time) ? ?Medications Ordered in UC ?Medications - No data to display ? ?Initial Impression / Assessment and Plan / UC Course  ?I have reviewed the triage vital signs and the  nursing notes. ? ?Pertinent labs & imaging results that were available during my care of the patient were reviewed by me and considered in my medical decision making (see chart for details). ? ?  ?Final Clinical Impressions(s) / UC Diagnoses  ? ?Final diagnoses:  ?Pain, dental  ?Swelling of gums  ? ? ? ?Discharge Instructions   ? ?  ?You were seen today for dental pain due to infection.  I have sent out an antibiotic to help with pain, swelling and infection.  You can take over the counter motrin/advil/aleve for pain.  ?You should follow up with a dentist for further care.  I have provided this list to you today.  ? ? ? ?ED Prescriptions   ? ? Medication Sig Dispense Auth. Provider  ? amoxicillin (AMOXIL) 500 MG capsule Take 1 capsule (500 mg total) by mouth 3 (three) times daily for 10 days. 30 capsule Jannifer Franklin, MD  ? ?  ? ?PDMP not reviewed this encounter. ?  Jannifer Franklin, MD ?08/29/21 1509 ? ?

## 2021-09-03 ENCOUNTER — Emergency Department (HOSPITAL_COMMUNITY)
Admission: EM | Admit: 2021-09-03 | Discharge: 2021-09-03 | Disposition: A | Discharge status: Home / Self Care | Payer: 59 | Attending: Emergency Medicine | Admitting: Emergency Medicine

## 2021-09-03 ENCOUNTER — Encounter (HOSPITAL_COMMUNITY): Payer: Self-pay | Admitting: Emergency Medicine

## 2021-09-03 DIAGNOSIS — M791 Myalgia, unspecified site: Secondary | ICD-10-CM | POA: Diagnosis not present

## 2021-09-03 DIAGNOSIS — R590 Localized enlarged lymph nodes: Secondary | ICD-10-CM | POA: Diagnosis not present

## 2021-09-03 DIAGNOSIS — J020 Streptococcal pharyngitis: Secondary | ICD-10-CM

## 2021-09-03 DIAGNOSIS — J029 Acute pharyngitis, unspecified: Secondary | ICD-10-CM | POA: Diagnosis present

## 2021-09-03 DIAGNOSIS — R0981 Nasal congestion: Secondary | ICD-10-CM | POA: Diagnosis not present

## 2021-09-03 DIAGNOSIS — R059 Cough, unspecified: Secondary | ICD-10-CM | POA: Insufficient documentation

## 2021-09-03 DIAGNOSIS — R509 Fever, unspecified: Secondary | ICD-10-CM | POA: Diagnosis not present

## 2021-09-03 MED ORDER — PENICILLIN G BENZATHINE 1200000 UNIT/2ML IM SUSY
1.2000 10*6.[IU] | PREFILLED_SYRINGE | Freq: Once | INTRAMUSCULAR | Status: AC
  Administered 2021-09-03: 1.2 10*6.[IU] via INTRAMUSCULAR
  Filled 2021-09-03: qty 2

## 2021-09-03 MED ORDER — DEXAMETHASONE SODIUM PHOSPHATE 10 MG/ML IJ SOLN
10.0000 mg | Freq: Once | INTRAMUSCULAR | Status: AC
  Administered 2021-09-03: 10 mg via INTRAMUSCULAR
  Filled 2021-09-03: qty 1

## 2021-09-03 MED ORDER — LIDOCAINE VISCOUS HCL 2 % MT SOLN: 15.0000 mL | Freq: Four times a day (QID) | OROMUCOSAL | 0 refills | Status: DC | PRN

## 2021-09-03 NOTE — Discharge Instructions (Addendum)
You came to the emergency department today to be evaluated for your sore throat.  Your physical exam was consistent with strep pharyngitis.  Due to this you were given a shot of steroids and antibiotics.  Additionally have sent you home with a prescription for viscous lidocaine, you may take this every 6 hours to help with your sore throat. ? ?Please take Ibuprofen (Advil, motrin) and Tylenol (acetaminophen) to relieve your pain.   ? ?You may take up to 600 MG (3 pills) of normal strength ibuprofen every 8 hours as needed.   ?You make take tylenol, up to 1,000 mg (two extra strength pills) every 8 hours as needed.  ? ?It is safe to take ibuprofen and tylenol at the same time as they work differently.  ? Do not take more than 3,000 mg tylenol in a 24 hour period (not more than one dose every 8 hours.  Please check all medication labels as many medications such as pain and cold medications may contain tylenol.  Do not drink alcohol while taking these medications.  Do not take other NSAID'S while taking ibuprofen (such as aleve or naproxen).  Please take ibuprofen with food to decrease stomach upset. ? ?Get help right away if: ?You have new symptoms, such as vomiting, severe headache, stiff or painful neck, chest pain, or shortness of breath. ?You have severe throat pain, drooling, or changes in your voice. ?You have swelling of the neck, or the skin on the neck becomes red and tender. ?You have signs of dehydration, such as tiredness (fatigue), dry mouth, and decreased urination. ?You become increasingly sleepy, or you cannot wake up completely. ?Your joints become red or painful. ?

## 2021-09-03 NOTE — ED Triage Notes (Signed)
Pt reports sore throat, headache and chills since yesterday. Pt worried he has strep since he gets it every year.  ?

## 2021-09-03 NOTE — ED Provider Notes (Signed)
?[facility] ?Provider Note ? ? ?CSN: [id number] ?Arrival date & time: 09/03/21  1701 ? ?  ? ?History ? ?Chief Complaint  ?Patient presents with  ? Sore Throat  ? ? ?Charles Atkinson is a 26 y.o. male with no pertinent past medical history.  Presents emergency department with a chief complaint of sore throat.  Patient reports that his sore throat started this morning.  Patient reports pain with swallowing however is able to swallow without difficulty.  Patient endorses that he has had fevers, generalized myalgia, nonproductive cough, and nasal congestion started today as well. ? ?Patient denies any trismus, drooling, hot potato voice, neck stiffness, chest pain, shortness of breath, abdominal pain, nausea, vomiting, diarrhea ? ? ?Sore Throat ?Pertinent negatives include no chest pain, no abdominal pain and no shortness of breath.  ? ?  ? ?Home Medications ?Prior to Admission medications   ?Medication Sig Start Date End Date Taking? Authorizing Provider  ?lidocaine (XYLOCAINE) 2 % solution Use as directed 15 mLs in the mouth or throat every 6 (six) hours as needed for mouth pain. 09/03/21  Yes Loni Beckwith, PA-C  ?amoxicillin (AMOXIL) 500 MG capsule Take 1 capsule (500 mg total) by mouth 3 (three) times daily for 10 days. 08/29/21 09/08/21  Rondel Oh, MD  ?ibuprofen (ADVIL) 800 MG tablet Take 1 tablet (800 mg total) by mouth every 8 (eight) hours as needed for moderate pain. 11/14/20   Milton Ferguson, MD  ?   ? ?Allergies    ?Patient has no known allergies.   ? ?Review of Systems   ?Review of Systems  ?Constitutional:  Positive for chills and fever.  ?HENT:  Positive for congestion and sore throat. Negative for drooling, facial swelling, trouble swallowing and voice change.   ?Respiratory:  Positive for cough. Negative for shortness of breath.   ?Cardiovascular:  Negative for chest pain.  ?Gastrointestinal:  Negative for abdominal pain, nausea and vomiting.   ?Musculoskeletal:  Positive for myalgias.  ?Allergic/Immunologic: Negative for immunocompromised state.  ?Psychiatric/Behavioral:  Negative for confusion.   ? ?Physical Exam ?Updated Vital Signs ?BP 134/66 (BP Location: Right Arm)   Pulse 98   Temp 100.3 ?F (37.9 ?C) (Oral)   Resp (!) 22   Ht 6\' 2"  (1.88 m)   Wt 77 kg   SpO2 98%   BMI 21.80 kg/m?  ?Physical Exam ?Vitals and nursing note reviewed.  ?Constitutional:   ?   General: He is not in acute distress. ?   Appearance: He is ill-appearing. He is not toxic-appearing or diaphoretic.  ?HENT:  ?   Head: Normocephalic.  ?   Jaw: There is normal jaw occlusion. No trismus or swelling.  ?   Salivary Glands: Right salivary gland is not diffusely enlarged or tender. Left salivary gland is not diffusely enlarged or tender.  ?   Mouth/Throat:  ?   Lips: Pink. No lesions.  ?   Mouth: Mucous membranes are moist.  ?   Tongue: No lesions. Tongue does not deviate from midline.  ?   Palate: No mass and lesions.  ?   Pharynx: Oropharynx is clear. Uvula midline. Posterior oropharyngeal erythema present. No pharyngeal swelling, oropharyngeal exudate or uvula swelling.  ?   Tonsils: Tonsillar exudate present. No tonsillar abscesses. 3+ on the right. 2+ on the left.  ?   Comments: Handles oral secretions without difficulty ?Eyes:  ?   General: No scleral icterus.    ?   Right eye:  No discharge.     ?   Left eye: No discharge.  ?Cardiovascular:  ?   Rate and Rhythm: Normal rate.  ?Pulmonary:  ?   Effort: Pulmonary effort is normal. No tachypnea, bradypnea or respiratory distress.  ?   Breath sounds: Normal breath sounds. No stridor.  ?Musculoskeletal:  ?   Cervical back: Full passive range of motion without pain, normal range of motion and neck supple. No edema, erythema, signs of trauma, rigidity, torticollis or crepitus. No pain with movement, spinous process tenderness or muscular tenderness. Normal range of motion.  ?Lymphadenopathy:  ?   Cervical: Cervical adenopathy  present.  ?   Right cervical: Superficial cervical adenopathy present.  ?   Left cervical: Superficial cervical adenopathy present.  ?Skin: ?   General: Skin is warm and dry.  ?Neurological:  ?   General: No focal deficit present.  ?   Mental Status: He is alert.  ?Psychiatric:     ?   Behavior: Behavior is cooperative.  ? ? ?ED Results / Procedures / Treatments   ?Labs ?(all labs ordered are listed, but only abnormal results are displayed) ?Labs Reviewed - No data to display ? ?EKG ?None ? ?Radiology ?No results found. ? ?Procedures ?Procedures  ? ? ?Medications Ordered in ED ?Medications  ?penicillin g benzathine (BICILLIN LA) 1200000 UNIT/2ML injection 1.2 Million Units (has no administration in time range)  ?dexamethasone (DECADRON) injection 10 mg (has no administration in time range)  ? ? ?ED Course/ Medical Decision Making/ A&P ?  ?                        ?Medical Decision Making ?Risk ?Prescription drug management. ? ? ?Alert 26 year old male no acute stress, nontoxic-appearing.  Presents emergency department with a chief complaint of sore throat. ? ?Information obtained from patient.  Past medical records reviewed including previous chart notes, labs, and imaging. ? ?Based on physical exam patient has signs and symptoms consistent with strep pharyngitis.  No signs of peritonsillar abscess, Ludewig's angina, or deep space neck infection. ? ?Shared decision making with patient about empiric treatment versus obtaining PCR with culture.  Patient elects for empiric treatment at this time.  We will treat patient with IM penicillin.  Additionally will give patient IM Decadron and prescribe him a short course of viscous lidocaine.  Discussed symptomatic treatment with Tylenol and ibuprofen. ? ?Based on patient's chief complaint, I considered admission might be necessary, however after reassuring ED workup feel patient is reasonable for discharge.  Discussed results, findings, treatment and follow up. Patient  advised of return precautions. Patient verbalized understanding and agreed with plan. ? ?Portions of this note were generated with [employer]. Dictation errors may occur despite best attempts at proofreading. ? ? ? ? ? ? ? ? ?Final Clinical Impression(s) / ED Diagnoses ?Final diagnoses:  ?Strep throat  ? ? ?Rx / DC Orders ?ED Discharge Orders   ? ?      Ordered  ?  lidocaine (XYLOCAINE) 2 % solution  Every 6 hours PRN       ? 09/03/21 1730  ? ?  ?  ? ?  ? ? ?  ?Loni Beckwith, PA-C ?09/03/21 1743 ? ?  ?Lucrezia Starch, MD ?09/03/21 2200 ? ?

## 2022-01-05 ENCOUNTER — Ambulatory Visit (HOSPITAL_COMMUNITY)
Admission: EM | Admit: 2022-01-05 | Discharge: 2022-01-05 | Disposition: A | Discharge status: Home / Self Care | Payer: 59 | Attending: Internal Medicine | Admitting: Internal Medicine

## 2022-01-05 ENCOUNTER — Encounter (HOSPITAL_COMMUNITY): Payer: Self-pay

## 2022-01-05 ENCOUNTER — Ambulatory Visit (INDEPENDENT_AMBULATORY_CARE_PROVIDER_SITE_OTHER): Payer: 59

## 2022-01-05 DIAGNOSIS — M25531 Pain in right wrist: Secondary | ICD-10-CM | POA: Diagnosis not present

## 2022-01-05 DIAGNOSIS — M79641 Pain in right hand: Secondary | ICD-10-CM | POA: Diagnosis not present

## 2022-01-05 DIAGNOSIS — S6991XA Unspecified injury of right wrist, hand and finger(s), initial encounter: Secondary | ICD-10-CM

## 2022-01-05 MED ORDER — IBUPROFEN 800 MG PO TABS
ORAL_TABLET | ORAL | Status: AC
  Filled 2022-01-05: qty 1

## 2022-01-05 MED ORDER — IBUPROFEN 800 MG PO TABS
800.0000 mg | ORAL_TABLET | Freq: Once | ORAL | Status: AC
  Administered 2022-01-05: 800 mg via ORAL

## 2022-01-05 NOTE — Discharge Instructions (Addendum)
I recommend using ibuprofen, up to 800 mg every 6 hours.  This medicine will help with pain and inflammation in the wrist.  You can also apply ice for 20 minutes at a time multiple times daily.  This can help with swelling.  You can wear the wrist brace as needed for comfort and support.  I recommend avoiding heavy lifting/weight lifting as this will increase swelling and pain in the wrist.  If symptoms persist despite trying these interventions, you can follow-up with the orthopedic specialist.  Their contact information is below.

## 2022-01-05 NOTE — ED Triage Notes (Signed)
Pt states right hand pain for the past 2 days. States he was at an arcade and was hitting a punching bag but he missed and hit a pole instead.
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
## 2022-01-20 ENCOUNTER — Emergency Department (HOSPITAL_COMMUNITY): Payer: 59

## 2022-01-20 ENCOUNTER — Emergency Department (HOSPITAL_COMMUNITY)
Admission: EM | Admit: 2022-01-20 | Discharge: 2022-01-21 | Disposition: A | Discharge status: Home / Self Care | Payer: 59 | Attending: Emergency Medicine | Admitting: Emergency Medicine

## 2022-01-20 ENCOUNTER — Encounter (HOSPITAL_COMMUNITY): Payer: Self-pay | Admitting: Emergency Medicine

## 2022-01-20 DIAGNOSIS — S80211A Abrasion, right knee, initial encounter: Secondary | ICD-10-CM | POA: Diagnosis not present

## 2022-01-20 DIAGNOSIS — M7918 Myalgia, other site: Secondary | ICD-10-CM | POA: Insufficient documentation

## 2022-01-20 DIAGNOSIS — R0781 Pleurodynia: Secondary | ICD-10-CM | POA: Diagnosis not present

## 2022-01-20 DIAGNOSIS — H1131 Conjunctival hemorrhage, right eye: Secondary | ICD-10-CM | POA: Diagnosis not present

## 2022-01-20 DIAGNOSIS — R519 Headache, unspecified: Secondary | ICD-10-CM | POA: Diagnosis not present

## 2022-01-20 DIAGNOSIS — M940 Chondrocostal junction syndrome [Tietze]: Secondary | ICD-10-CM

## 2022-01-20 DIAGNOSIS — S8991XA Unspecified injury of right lower leg, initial encounter: Secondary | ICD-10-CM | POA: Diagnosis present

## 2022-01-20 DIAGNOSIS — S80212A Abrasion, left knee, initial encounter: Secondary | ICD-10-CM | POA: Diagnosis not present

## 2022-01-20 DIAGNOSIS — M545 Low back pain, unspecified: Secondary | ICD-10-CM | POA: Diagnosis not present

## 2022-01-20 DIAGNOSIS — S300XXA Contusion of lower back and pelvis, initial encounter: Secondary | ICD-10-CM

## 2022-01-20 LAB — CBC
HCT: 44.7 % (ref 39.0–52.0)
Hemoglobin: 14.5 g/dL (ref 13.0–17.0)
MCH: 28.8 pg (ref 26.0–34.0)
MCHC: 32.4 g/dL (ref 30.0–36.0)
MCV: 88.9 fL (ref 80.0–100.0)
Platelets: 279 10*3/uL (ref 150–400)
RBC: 5.03 MIL/uL (ref 4.22–5.81)
RDW: 12.8 % (ref 11.5–15.5)
WBC: 7.5 10*3/uL (ref 4.0–10.5)
nRBC: 0 % (ref 0.0–0.2)

## 2022-01-20 LAB — COMPREHENSIVE METABOLIC PANEL
ALT: 36 U/L (ref 0–44)
AST: 51 U/L — ABNORMAL HIGH (ref 15–41)
Albumin: 3.8 g/dL (ref 3.5–5.0)
Alkaline Phosphatase: 60 U/L (ref 38–126)
Anion gap: 10 (ref 5–15)
BUN: 9 mg/dL (ref 6–20)
CO2: 23 mmol/L (ref 22–32)
Calcium: 9.3 mg/dL (ref 8.9–10.3)
Chloride: 106 mmol/L (ref 98–111)
Creatinine, Ser: 0.94 mg/dL (ref 0.61–1.24)
GFR, Estimated: >60 mL/min (ref >60)
Glucose, Bld: 100 mg/dL — ABNORMAL HIGH (ref 70–99)
Potassium: 3.9 mmol/L (ref 3.5–5.1)
Sodium: 139 mmol/L (ref 135–145)
Total Bilirubin: 0.5 mg/dL (ref 0.3–1.2)
Total Protein: 7.9 g/dL (ref 6.5–8.1)

## 2022-01-20 MED ORDER — CYCLOBENZAPRINE HCL 10 MG PO TABS: 10.0000 mg | ORAL_TABLET | Freq: Two times a day (BID) | ORAL | 0 refills | Status: DC | PRN

## 2022-01-20 MED ORDER — IBUPROFEN 800 MG PO TABS: 800.0000 mg | ORAL_TABLET | Freq: Three times a day (TID) | ORAL | 0 refills | Status: DC | PRN

## 2022-01-20 MED ORDER — MORPHINE SULFATE (PF) 4 MG/ML IV SOLN
4.0000 mg | Freq: Once | INTRAVENOUS | Status: AC
  Administered 2022-01-20: 4 mg via INTRAVENOUS
  Filled 2022-01-20: qty 1
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
## 2022-01-21 ENCOUNTER — Emergency Department (HOSPITAL_COMMUNITY): Payer: 59

## 2022-01-21 MED ORDER — KETOROLAC TROMETHAMINE 15 MG/ML IJ SOLN
15.0000 mg | Freq: Once | INTRAMUSCULAR | Status: AC
Start: 2022-01-21 — End: 2022-01-21
  Administered 2022-01-21: 15 mg via INTRAVENOUS
  Filled 2022-01-21: qty 1

## 2022-01-21 MED ORDER — IOHEXOL 350 MG/ML SOLN
75.0000 mL | Freq: Once | INTRAVENOUS | Status: AC | PRN
  Administered 2022-01-21: 75 mL via INTRAVENOUS

## 2022-01-21 NOTE — ED Provider Notes (Signed)
00:15: Assumed care of patient from [place] @ shift change pending CT imaging & disposition- if no significant injury plan is for discharge home.   CT chest/abdomen/pelvis & L spine reassuring, no significant traumatic injury. Given incentive spirometer in the ED. Toradol for additional pain control here. Discharge paperwork prepared by prior provider including prescription medications.   I discussed results, treatment plan, need for follow-up, and return precautions with the patient. Provided opportunity for questions, patient confirmed understanding and is in agreement with plan.     Leafy Kindle 01/21/22 0240    Merrily Pew, MD 01/21/22 [phone]

## 2022-01-21 NOTE — ED Notes (Signed)
Returned from CT.

## 2022-01-21 NOTE — ED Notes (Signed)
This RN reviewed discharge instructions with pt. Pt given incentive spirometer and educated on use. Verbalized understanding and had no further questions. VSS upon discharge

## 2022-03-11 ENCOUNTER — Other Ambulatory Visit: Payer: Self-pay

## 2022-03-11 ENCOUNTER — Emergency Department (HOSPITAL_COMMUNITY)
Admission: EM | Admit: 2022-03-11 | Discharge: 2022-03-11 | Disposition: A | Discharge status: Home / Self Care | Payer: 59 | Source: Home / Self Care | Attending: Emergency Medicine | Admitting: Emergency Medicine

## 2022-03-11 ENCOUNTER — Emergency Department (HOSPITAL_COMMUNITY)
Admission: EM | Admit: 2022-03-11 | Discharge: 2022-03-11 | Discharge status: Against Medical Advice | Payer: 59 | Attending: Emergency Medicine | Admitting: Emergency Medicine

## 2022-03-11 ENCOUNTER — Encounter (HOSPITAL_COMMUNITY): Payer: Self-pay

## 2022-03-11 ENCOUNTER — Encounter (HOSPITAL_COMMUNITY): Payer: Self-pay | Admitting: Emergency Medicine

## 2022-03-11 ENCOUNTER — Emergency Department (HOSPITAL_COMMUNITY): Payer: 59

## 2022-03-11 DIAGNOSIS — M791 Myalgia, unspecified site: Secondary | ICD-10-CM | POA: Insufficient documentation

## 2022-03-11 DIAGNOSIS — R112 Nausea with vomiting, unspecified: Secondary | ICD-10-CM | POA: Insufficient documentation

## 2022-03-11 DIAGNOSIS — J020 Streptococcal pharyngitis: Secondary | ICD-10-CM | POA: Insufficient documentation

## 2022-03-11 DIAGNOSIS — R509 Fever, unspecified: Secondary | ICD-10-CM | POA: Insufficient documentation

## 2022-03-11 DIAGNOSIS — D72829 Elevated white blood cell count, unspecified: Secondary | ICD-10-CM | POA: Insufficient documentation

## 2022-03-11 DIAGNOSIS — Z1152 Encounter for screening for COVID-19: Secondary | ICD-10-CM | POA: Diagnosis not present

## 2022-03-11 DIAGNOSIS — Z20822 Contact with and (suspected) exposure to covid-19: Secondary | ICD-10-CM | POA: Insufficient documentation

## 2022-03-11 DIAGNOSIS — R197 Diarrhea, unspecified: Secondary | ICD-10-CM | POA: Diagnosis not present

## 2022-03-11 DIAGNOSIS — Z5321 Procedure and treatment not carried out due to patient leaving prior to being seen by health care provider: Secondary | ICD-10-CM | POA: Insufficient documentation

## 2022-03-11 DIAGNOSIS — R7989 Other specified abnormal findings of blood chemistry: Secondary | ICD-10-CM | POA: Insufficient documentation

## 2022-03-11 DIAGNOSIS — E860 Dehydration: Secondary | ICD-10-CM

## 2022-03-11 DIAGNOSIS — R059 Cough, unspecified: Secondary | ICD-10-CM | POA: Diagnosis not present

## 2022-03-11 LAB — COMPREHENSIVE METABOLIC PANEL
ALT: 25 U/L (ref 0–44)
AST: 29 U/L (ref 15–41)
Albumin: 3.8 g/dL (ref 3.5–5.0)
Alkaline Phosphatase: 68 U/L (ref 38–126)
Anion gap: 14 (ref 5–15)
BUN: 12 mg/dL (ref 6–20)
CO2: 23 mmol/L (ref 22–32)
Calcium: 9.3 mg/dL (ref 8.9–10.3)
Chloride: 101 mmol/L (ref 98–111)
Creatinine, Ser: 1.35 mg/dL — ABNORMAL HIGH (ref 0.61–1.24)
GFR, Estimated: >60 mL/min (ref >60)
Glucose, Bld: 147 mg/dL — ABNORMAL HIGH (ref 70–99)
Potassium: 3.4 mmol/L — ABNORMAL LOW (ref 3.5–5.1)
Sodium: 138 mmol/L (ref 135–145)
Total Bilirubin: 0.7 mg/dL (ref 0.3–1.2)
Total Protein: 7.2 g/dL (ref 6.5–8.1)

## 2022-03-11 LAB — CBC WITH DIFFERENTIAL/PLATELET
Abs Immature Granulocytes: 0.1 10*3/uL — ABNORMAL HIGH (ref 0.00–0.07)
Basophils Absolute: 0 10*3/uL (ref 0.0–0.1)
Basophils Relative: 0 %
Eosinophils Absolute: 0 10*3/uL (ref 0.0–0.5)
Eosinophils Relative: 0 %
HCT: 40.9 % (ref 39.0–52.0)
Hemoglobin: 13.9 g/dL (ref 13.0–17.0)
Immature Granulocytes: 1 %
Lymphocytes Relative: 8 %
Lymphs Abs: 1.1 10*3/uL (ref 0.7–4.0)
MCH: 29.9 pg (ref 26.0–34.0)
MCHC: 34 g/dL (ref 30.0–36.0)
MCV: 88 fL (ref 80.0–100.0)
Monocytes Absolute: 1.5 10*3/uL — ABNORMAL HIGH (ref 0.1–1.0)
Monocytes Relative: 11 %
Neutro Abs: 11.4 10*3/uL — ABNORMAL HIGH (ref 1.7–7.7)
Neutrophils Relative %: 80 %
Platelets: 265 10*3/uL (ref 150–400)
RBC: 4.65 MIL/uL (ref 4.22–5.81)
RDW: 13.7 % (ref 11.5–15.5)
WBC: 14.2 10*3/uL — ABNORMAL HIGH (ref 4.0–10.5)
nRBC: 0 % (ref 0.0–0.2)

## 2022-03-11 LAB — GROUP A STREP BY PCR: Group A Strep by PCR: DETECTED — AB

## 2022-03-11 LAB — LIPASE, BLOOD: Lipase: 23 U/L (ref 11–51)

## 2022-03-11 LAB — RESP PANEL BY RT-PCR (FLU A&B, COVID) ARPGX2
Influenza A by PCR: NEGATIVE
Influenza A by PCR: NEGATIVE
Influenza B by PCR: NEGATIVE
Influenza B by PCR: NEGATIVE
SARS Coronavirus 2 by RT PCR: NEGATIVE
SARS Coronavirus 2 by RT PCR: NEGATIVE

## 2022-03-11 MED ORDER — ONDANSETRON HCL 4 MG PO TABS: 4.0000 mg | ORAL_TABLET | ORAL | 0 refills | Status: DC | PRN

## 2022-03-11 MED ORDER — IBUPROFEN 800 MG PO TABS: 800.0000 mg | ORAL_TABLET | Freq: Once | ORAL | Status: DC

## 2022-03-11 MED ORDER — LIDOCAINE VISCOUS HCL 2 % MT SOLN
15.0000 mL | Freq: Once | OROMUCOSAL | Status: AC
  Administered 2022-03-11: 15 mL via OROMUCOSAL
  Filled 2022-03-11: qty 15

## 2022-03-11 MED ORDER — DEXAMETHASONE SODIUM PHOSPHATE 10 MG/ML IJ SOLN
10.0000 mg | Freq: Once | INTRAMUSCULAR | Status: AC
  Administered 2022-03-11: 10 mg
  Filled 2022-03-11: qty 1

## 2022-03-11 MED ORDER — ONDANSETRON 4 MG PO TBDP
4.0000 mg | ORAL_TABLET | Freq: Once | ORAL | Status: AC
  Administered 2022-03-11: 4 mg via ORAL
  Filled 2022-03-11: qty 1

## 2022-03-11 MED ORDER — ACETAMINOPHEN 325 MG PO TABS
650.0000 mg | ORAL_TABLET | Freq: Once | ORAL | Status: AC
  Administered 2022-03-11: 650 mg via ORAL
  Filled 2022-03-11: qty 2

## 2022-03-11 MED ORDER — ACETAMINOPHEN 325 MG PO TABS: 650.0000 mg | ORAL_TABLET | Freq: Four times a day (QID) | ORAL | 0 refills | Status: DC | PRN

## 2022-03-11 MED ORDER — ACETAMINOPHEN 500 MG PO TABS
1000.0000 mg | ORAL_TABLET | Freq: Once | ORAL | Status: AC
  Administered 2022-03-11: 1000 mg via ORAL
  Filled 2022-03-11: qty 2

## 2022-03-11 MED ORDER — AMOXICILLIN 500 MG PO CAPS: 500.0000 mg | ORAL_CAPSULE | Freq: Two times a day (BID) | ORAL | 0 refills | Status: AC

## 2022-03-11 MED ORDER — KETOROLAC TROMETHAMINE 30 MG/ML IJ SOLN
30.0000 mg | Freq: Once | INTRAMUSCULAR | Status: AC
  Administered 2022-03-11: 30 mg via INTRAMUSCULAR
  Filled 2022-03-11: qty 1

## 2022-03-11 MED ORDER — IBUPROFEN 600 MG PO TABS: 600.0000 mg | ORAL_TABLET | Freq: Four times a day (QID) | ORAL | 0 refills | Status: DC | PRN

## 2022-03-11 NOTE — ED Triage Notes (Signed)
Pt complaining of cough, fever, cold chills, nausea and vomiting, for the last 2 days. Has not taken anything for it. Worse today

## 2022-03-11 NOTE — ED Triage Notes (Signed)
Patient reports fever with emesis , body aches and dry dry cough onset last night .

## 2022-03-11 NOTE — Discharge Instructions (Addendum)
It was a pleasure caring for you today in the emergency department.  Your chest x-ray did not show any evidence of pneumonia, you were positive for strep throat on labs today. Your covid and flu test was fortunately negative.  Please drink lots of liquids over the next few days and get lots of rest.   Return for worsening or persistent or severe symptoms, trouble breathing, trouble talking, wheezing, chest pain, repeated vomiting or diarrhea, dehydration, inability take her keep down liquids, lethargy, inability to arouse, confusion, decreased activity level, poor coloring, bluish lips or face, decreased urine output, extreme fatigue or any other concerns.     Please return to the emergency department for any worsening or worrisome symptoms.

## 2022-03-11 NOTE — ED Notes (Signed)
Patient left ama.

## 2022-03-11 NOTE — ED Provider Notes (Incomplete)
[facility]-EMERGENCY DEPT Provider Note   CSN: [id number] Arrival date & time: 03/11/22  1746     History {Add pertinent medical, surgical, social history, OB history to HPI:1} Chief Complaint  Patient presents with   Fever    Charles Atkinson is a 26 y.o. male.  Patient as above with significant medical history as below, including prior hand surgery who presents to the ED with complaint of fatigue, fever, nausea, body aches, cough, malaise. Symptom onset 2 days ago. He presented to ED earlier this AM but left 2/2 long wait. No medications PTA. No dib. No sick contacts or recent travel. He is tolerating PO, ongoing nausea is improving, no further vomiting. No abd pain, no cp. No dysuria. No diarrhea. No BRBPR or melena. Sore throat, no drooling, tolerating his secretions.      History reviewed. No pertinent past medical history.  Past Surgical History:  Procedure Laterality Date   HAND SURGERY Right      The history is provided by the patient. No language interpreter was used.  Fever Associated symptoms: congestion, cough, nausea and sore throat   Associated symptoms: no chest pain, no chills, no confusion, no headaches, no rash and no vomiting        Home Medications Prior to Admission medications   Medication Sig Start Date End Date Taking? Authorizing Provider  cyclobenzaprine (FLEXERIL) 10 MG tablet Take 1 tablet (10 mg total) by mouth 2 (two) times daily as needed for muscle spasms. 01/20/22   Fayrene Helper, PA-C  ibuprofen (ADVIL) 800 MG tablet Take 1 tablet (800 mg total) by mouth every 8 (eight) hours as needed for moderate pain. 01/20/22   Fayrene Helper, PA-C      Allergies    Patient has no known allergies.    Review of Systems   Review of Systems  Constitutional:  Positive for fatigue and fever. Negative for chills.  HENT:  Positive for congestion and sore throat. Negative for facial swelling and trouble swallowing.   Eyes:  Negative for  photophobia and visual disturbance.  Respiratory:  Positive for cough. Negative for shortness of breath.   Cardiovascular:  Negative for chest pain and palpitations.  Gastrointestinal:  Positive for nausea. Negative for abdominal pain and vomiting.  Endocrine: Negative for polydipsia and polyuria.  Genitourinary:  Negative for difficulty urinating and hematuria.  Musculoskeletal:  Positive for arthralgias. Negative for gait problem and joint swelling.  Skin:  Negative for pallor and rash.  Neurological:  Negative for syncope and headaches.  Psychiatric/Behavioral:  Negative for agitation and confusion.     Physical Exam Updated Vital Signs BP 125/68 (BP Location: Right Arm)   Pulse (!) 104   Temp (!) 102.6 F (39.2 C) (Oral)   Resp 18   Ht 6\' 2"  (1.88 m)   Wt 79.4 kg   SpO2 100%   BMI 22.47 kg/m  Physical Exam Vitals and nursing note reviewed.  Constitutional:      General: He is not in acute distress.    Appearance: He is well-developed.  HENT:     Head: Normocephalic and atraumatic. No right periorbital erythema or left periorbital erythema.     Jaw: There is normal jaw occlusion. No trismus.     Right Ear: External ear normal.     Left Ear: External ear normal.     Mouth/Throat:     Mouth: Mucous membranes are moist.     Pharynx: Uvula midline. Posterior oropharyngeal erythema present. No uvula  swelling.  Eyes:     General: No scleral icterus. Neck:     Meningeal: Brudzinski's sign and Kernig's sign absent.  Cardiovascular:     Rate and Rhythm: Normal rate and regular rhythm.     Pulses: Normal pulses.     Heart sounds: Normal heart sounds.  Pulmonary:     Effort: Pulmonary effort is normal. No respiratory distress.     Breath sounds: Normal breath sounds.  Abdominal:     General: Abdomen is flat.     Palpations: Abdomen is soft.     Tenderness: There is no abdominal tenderness. There is no guarding or rebound.  Musculoskeletal:        General: Normal range of  motion.     Cervical back: Full passive range of motion without pain and normal range of motion.     Right lower leg: No edema.     Left lower leg: No edema.  Skin:    General: Skin is warm and dry.     Capillary Refill: Capillary refill takes less than 2 seconds.  Neurological:     Mental Status: He is alert and oriented to person, place, and time.     GCS: GCS eye subscore is 4. GCS verbal subscore is 5. GCS motor subscore is 6.  Psychiatric:        Mood and Affect: Mood normal.        Behavior: Behavior normal.     ED Results / Procedures / Treatments   Labs (all labs ordered are listed, but only abnormal results are displayed) Labs Reviewed  GROUP A STREP BY PCR - Abnormal; Notable for the following components:      Result Value   Group A Strep by PCR DETECTED (*)    All other components within normal limits  RESP PANEL BY RT-PCR (FLU A&B, COVID) ARPGX2    EKG None  Radiology No results found.  Procedures Procedures  {Document cardiac monitor, telemetry assessment procedure when appropriate:1}  Medications Ordered in ED Medications  acetaminophen (TYLENOL) tablet 650 mg (650 mg Oral Given 03/11/22 2116)  lidocaine (XYLOCAINE) 2 % viscous mouth solution 15 mL (15 mLs Mouth/Throat Given 03/11/22 2116)  dexamethasone (DECADRON) injection 10 mg (10 mg Other Given 03/11/22 2116)  ondansetron (ZOFRAN-ODT) disintegrating tablet 4 mg (4 mg Oral Given 03/11/22 2116)  ketorolac (TORADOL) 30 MG/ML injection 30 mg (30 mg Intramuscular Given 03/11/22 2114)    ED Course/ Medical Decision Making/ A&P                           Medical Decision Making Amount and/or Complexity of Data Reviewed Radiology: ordered.  Risk OTC drugs. Prescription drug management.   This patient presents to the ED with chief complaint(s) of uri s/s with pertinent past medical history of above which further complicates the presenting complaint. The complaint involves an extensive differential  diagnosis and also carries with it a high risk of complications and morbidity.    The differential diagnosis includes but not limited to viral syndrome, bacterial infection, pna, strep, covid, flu, etc. Serious etiologies were considered.   The initial plan is to rvp, strep, cxr   Additional history obtained: Additional history obtained from  na Records reviewed  prior ed visits, prior labs Labs reviewed from earlier this morning, creatinine elevated 1.35, baseline appears to be around 0.9.  Does report nausea and vomiting the past few days.  CBC with leukocytosis 14.2.  RVP  was negative.  Found to be positive for strep today.   Independent labs interpretation:  The following labs were independently interpreted: strep +, RVP neg  Independent visualization of imaging: - I independently visualized the following imaging with scope of interpretation limited to determining acute life threatening conditions related to emergency care: CXR, which revealed wnl  Cardiac monitoring was reviewed and interpreted by myself which shows na  Treatment and Reassessment: Toradol Tylenol Decadron Zofran Po chall >> symptoms improving   Consultation: - Consulted or discussed management/test interpretation w/ external professional: na  Consideration for admission or further workup: Admission was considered   Pt +tive for strep, will start abx, given steroids in ED. Discussed supportive care at home, rehydration. Concern for dehydration on labs earlier this AM, he is tolerating po well in the ED, encouraged ongoing rehydration.   The patient improved significantly and was discharged in stable condition. Detailed discussions were had with the patient regarding current findings, and need for close f/u with PCP or on call doctor. The patient has been instructed to return immediately if the symptoms worsen in any way for re-evaluation. Patient verbalized understanding and is in agreement with current care  plan. All questions answered prior to discharge.    Social Determinants of health: Social History   Tobacco Use   Smoking status: Never   Smokeless tobacco: Never  Substance Use Topics   Alcohol use: No   Drug use: No      {Document critical care time when appropriate:1} {Document review of labs and clinical decision tools ie heart score, Chads2Vasc2 etc:1}  {Document your independent review of radiology images, and any outside records:1} {Document your discussion with family members, caretakers, and with consultants:1} {Document social determinants of health affecting pt's care:1} {Document your decision making why or why not admission, treatments were needed:1} Final Clinical Impression(s) / ED Diagnoses Final diagnoses:  None    Rx / DC Orders ED Discharge Orders     None

## 2022-03-11 NOTE — ED Provider Triage Note (Signed)
Emergency Medicine Provider Triage Evaluation Note  Charles Atkinson , a 26 y.o. male  was evaluated in triage.  Pt complains of sudden onset N/V/D and flu like symptoms yesterday.  Denies sick contacts with similar.  Reports subjective fever/chills.  No meds taken PTA.  Review of Systems  Positive: N/V/D Negative: SOB, chset pain  Physical Exam  BP 135/73   Pulse 99   Temp 100 F (37.8 C) (Oral)   Resp 18   SpO2 96%   Gen:   Awake, no distress   Resp:  Normal effort  MSK:   Moves extremities without difficulty  Other:    Medical Decision Making  Medically screening exam initiated at 4:34 AM.  Appropriate orders placed.  Charles Atkinson was informed that the remainder of the evaluation will be completed by another provider, this initial triage assessment does not replace that evaluation, and the importance of remaining in the ED until their evaluation is complete.  N/V/D, cough/congestion.  Low grade temp in triage of 100F.  Will give tylenol, zofran.  Check labs, covid/flu screen.   Charles Hatchet, PA-C 03/11/22 [phone]

## 2022-03-11 NOTE — ED Provider Triage Note (Signed)
Emergency Medicine Provider Triage Evaluation Note  Charles Atkinson , a 26 y.o. male  was evaluated in triage.  Pt complains of fever, cough, nausea, vomiting, runny nose since yesterday.  Patient was seen at [facility] this morning, tested negative for COVID but left ama. No known sick contacts. Denies any chest pain, shortness of breath, constipation, diarrhea, urinary symptoms.  Review of Systems  Positive: As above Negative: As above  Physical Exam  BP 125/68 (BP Location: Right Arm)   Pulse (!) 104   Temp (!) 102.6 F (39.2 C) (Oral)   Resp 18   Ht 6\' 2"  (1.88 m)   Wt 79.4 kg   SpO2 100%   BMI 22.47 kg/m  Gen:   Awake, no distress   Resp:  Normal effort  MSK:   Moves extremities without difficulty  Other:    Medical Decision Making  Medically screening exam initiated at 7:45 PM.  Appropriate orders placed.  Charles Atkinson was informed that the remainder of the evaluation will be completed by another provider, this initial triage assessment does not replace that evaluation, and the importance of remaining in the ED until their evaluation is complete.     Charles Atkinson, Charles Atkinson 03/12/22 [phone]

## 2022-05-27 ENCOUNTER — Other Ambulatory Visit: Payer: Self-pay

## 2022-05-27 ENCOUNTER — Emergency Department (HOSPITAL_COMMUNITY)
Admission: EM | Admit: 2022-05-27 | Discharge: 2022-05-27 | Disposition: A | Discharge status: Home / Self Care | Payer: 59 | Attending: Emergency Medicine | Admitting: Emergency Medicine

## 2022-05-27 DIAGNOSIS — J111 Influenza due to unidentified influenza virus with other respiratory manifestations: Secondary | ICD-10-CM

## 2022-05-27 DIAGNOSIS — R059 Cough, unspecified: Secondary | ICD-10-CM | POA: Diagnosis present

## 2022-05-27 DIAGNOSIS — Z1152 Encounter for screening for COVID-19: Secondary | ICD-10-CM | POA: Insufficient documentation

## 2022-05-27 DIAGNOSIS — J101 Influenza due to other identified influenza virus with other respiratory manifestations: Secondary | ICD-10-CM | POA: Diagnosis not present

## 2022-05-27 LAB — CBC WITH DIFFERENTIAL/PLATELET
Abs Immature Granulocytes: 0.01 10*3/uL (ref 0.00–0.07)
Basophils Absolute: 0 10*3/uL (ref 0.0–0.1)
Basophils Relative: 0 %
Eosinophils Absolute: 0 10*3/uL (ref 0.0–0.5)
Eosinophils Relative: 0 %
HCT: 43.5 % (ref 39.0–52.0)
Hemoglobin: 14.8 g/dL (ref 13.0–17.0)
Immature Granulocytes: 0 %
Lymphocytes Relative: 34 %
Lymphs Abs: 1.9 10*3/uL (ref 0.7–4.0)
MCH: 29.1 pg (ref 26.0–34.0)
MCHC: 34 g/dL (ref 30.0–36.0)
MCV: 85.6 fL (ref 80.0–100.0)
Monocytes Absolute: 0.7 10*3/uL (ref 0.1–1.0)
Monocytes Relative: 13 %
Neutro Abs: 2.9 10*3/uL (ref 1.7–7.7)
Neutrophils Relative %: 53 %
Platelets: 211 10*3/uL (ref 150–400)
RBC: 5.08 MIL/uL (ref 4.22–5.81)
RDW: 12 % (ref 11.5–15.5)
WBC: 5.5 10*3/uL (ref 4.0–10.5)
nRBC: 0 % (ref 0.0–0.2)

## 2022-05-27 LAB — COMPREHENSIVE METABOLIC PANEL
ALT: 19 U/L (ref 0–44)
AST: 26 U/L (ref 15–41)
Albumin: 3.7 g/dL (ref 3.5–5.0)
Alkaline Phosphatase: 54 U/L (ref 38–126)
Anion gap: 8 (ref 5–15)
BUN: 13 mg/dL (ref 6–20)
CO2: 26 mmol/L (ref 22–32)
Calcium: 9.1 mg/dL (ref 8.9–10.3)
Chloride: 102 mmol/L (ref 98–111)
Creatinine, Ser: 1.37 mg/dL — ABNORMAL HIGH (ref 0.61–1.24)
GFR, Estimated: >60 mL/min (ref >60)
Glucose, Bld: 107 mg/dL — ABNORMAL HIGH (ref 70–99)
Potassium: 4 mmol/L (ref 3.5–5.1)
Sodium: 136 mmol/L (ref 135–145)
Total Bilirubin: 0.5 mg/dL (ref 0.3–1.2)
Total Protein: 7.2 g/dL (ref 6.5–8.1)

## 2022-05-27 LAB — RESP PANEL BY RT-PCR (RSV, FLU A&B, COVID)  RVPGX2
Influenza A by PCR: NEGATIVE
Influenza B by PCR: POSITIVE — AB
Resp Syncytial Virus by PCR: NEGATIVE
SARS Coronavirus 2 by RT PCR: NEGATIVE

## 2022-05-27 LAB — LIPASE, BLOOD: Lipase: 28 U/L (ref 11–51)

## 2022-05-27 MED ORDER — ACETAMINOPHEN 325 MG PO TABS
650.0000 mg | ORAL_TABLET | Freq: Once | ORAL | Status: AC
  Administered 2022-05-27: 650 mg via ORAL
  Filled 2022-05-27: qty 2

## 2022-05-27 MED ORDER — ONDANSETRON HCL 4 MG PO TABS: 4.0000 mg | ORAL_TABLET | Freq: Three times a day (TID) | ORAL | 0 refills | Status: DC | PRN

## 2022-05-27 MED ORDER — ONDANSETRON 4 MG PO TBDP
4.0000 mg | ORAL_TABLET | Freq: Once | ORAL | Status: AC
  Administered 2022-05-27: 4 mg via ORAL
  Filled 2022-05-27: qty 1

## 2022-05-27 MED ORDER — IBUPROFEN 400 MG PO TABS
600.0000 mg | ORAL_TABLET | Freq: Once | ORAL | Status: AC
  Administered 2022-05-27: 600 mg via ORAL
  Filled 2022-05-27: qty 1

## 2022-05-27 NOTE — ED Triage Notes (Signed)
Patient reports productive cough /chills , generalized body aches , fatigue and emesis this week .

## 2022-05-27 NOTE — ED Provider Notes (Signed)
[place] EMERGENCY DEPARTMENT AT [facility] Provider Note  CSN: [id number] Arrival date & time: 05/27/22 0110  Chief Complaint(s) Chills/Cough/Bodyaches (Fever)  HPI Charles Atkinson is a 27 y.o. male presenting to the emergency department with bodyaches.  He reports associated fevers and chills, nausea, vomiting, diarrhea, cough, runny nose.  He has been taking TheraFlu and Excedrin with minimal relief.  No abdominal pain or chest pain.  Symptoms present for 3 days.   Past Medical History No past medical history on file. There are no problems to display for this patient.  Home Medication(s) Prior to Admission medications   Medication Sig Start Date End Date Taking? Authorizing Provider  acetaminophen (TYLENOL) 325 MG tablet Take 2 tablets (650 mg total) by mouth every 6 (six) hours as needed. 03/11/22   Sloan Leiter, DO  cyclobenzaprine (FLEXERIL) 10 MG tablet Take 1 tablet (10 mg total) by mouth 2 (two) times daily as needed for muscle spasms. 01/20/22   Fayrene Helper, PA-C  ibuprofen (ADVIL) 600 MG tablet Take 1 tablet (600 mg total) by mouth every 6 (six) hours as needed. 03/11/22   Sloan Leiter, DO  ibuprofen (ADVIL) 800 MG tablet Take 1 tablet (800 mg total) by mouth every 8 (eight) hours as needed for moderate pain. 01/20/22   Fayrene Helper, PA-C  ondansetron (ZOFRAN) 4 MG tablet Take 1 tablet (4 mg total) by mouth every 8 (eight) hours as needed for nausea or vomiting. 05/27/22   Lonell Grandchild, MD                                                                                                                                    Past Surgical History Past Surgical History:  Procedure Laterality Date   HAND SURGERY Right    Family History No family history on file.  Social History Social History   Tobacco Use   Smoking status: Never   Smokeless tobacco: Never  Substance Use Topics   Alcohol use: No   Drug use: No   Allergies Patient has no known  allergies.  Review of Systems Review of Systems  All other systems reviewed and are negative.   Physical Exam Vital Signs  I have reviewed the triage vital signs BP 134/80   Pulse 72   Temp 98.8 F (37.1 C)   Resp 18   SpO2 99%  Physical Exam Vitals and nursing note reviewed.  Constitutional:      General: He is not in acute distress.    Appearance: Normal appearance.  HENT:     Mouth/Throat:     Mouth: Mucous membranes are moist.  Eyes:     Conjunctiva/sclera: Conjunctivae normal.  Cardiovascular:     Rate and Rhythm: Normal rate and regular rhythm.  Pulmonary:     Effort: Pulmonary effort is normal. No respiratory distress.     Breath sounds: Normal breath sounds.  Abdominal:  General: Abdomen is flat.     Palpations: Abdomen is soft.     Tenderness: There is no abdominal tenderness.  Musculoskeletal:     Right lower leg: No edema.     Left lower leg: No edema.  Skin:    General: Skin is warm and dry.     Capillary Refill: Capillary refill takes less than 2 seconds.  Neurological:     Mental Status: He is alert and oriented to person, place, and time. Mental status is at baseline.  Psychiatric:        Mood and Affect: Mood normal.        Behavior: Behavior normal.     ED Results and Treatments Labs (all labs ordered are listed, but only abnormal results are displayed) Labs Reviewed  RESP PANEL BY RT-PCR (RSV, FLU A&B, COVID)  RVPGX2 - Abnormal; Notable for the following components:      Result Value   Influenza B by PCR POSITIVE (*)    All other components within normal limits  COMPREHENSIVE METABOLIC PANEL - Abnormal; Notable for the following components:   Glucose, Bld 107 (*)    Creatinine, Ser 1.37 (*)    All other components within normal limits  CBC WITH DIFFERENTIAL/PLATELET  LIPASE, BLOOD                                                                                                                          Radiology No results  found.  Pertinent labs & imaging results that were available during my care of the patient were reviewed by me and considered in my medical decision making (see MDM for details).  Medications Ordered in ED Medications  ibuprofen (ADVIL) tablet 600 mg (has no administration in time range)  acetaminophen (TYLENOL) tablet 650 mg (650 mg Oral Given 05/27/22 0154)  ondansetron (ZOFRAN-ODT) disintegrating tablet 4 mg (4 mg Oral Given 05/27/22 0154)                                                                                                                                     Procedures Procedures  (including critical care time)  Medical Decision Making / ED Course   MDM:  27 year old male presenting to the emergency department with bodyaches.  Patient is well-appearing, physical exam overall reassuring with no abdominal tenderness.  Lungs clear.  Suspect symptoms are all explained by influenza.  Patient  has a positive influenza test.  He reports 3 days of symptoms and no severe risk factors for prescription of Tamiflu.  Will prescribe Zofran given nausea and vomiting.  Discussed self-care for symptoms at home.  Extremely low concern for underlying bacterial illness given well appearance and normal exam with positive flu test.  Does have a mild AKI likely due to dehydration.  Discussed increasing p.o. intake.  Will discharge patient to home. All questions answered. Patient comfortable with plan of discharge. Return precautions discussed with patient and specified on the after visit summary.        Lab Tests: -I ordered, reviewed, and interpreted labs.   The pertinent results include:   Labs Reviewed  RESP PANEL BY RT-PCR (RSV, FLU A&B, COVID)  RVPGX2 - Abnormal; Notable for the following components:      Result Value   Influenza B by PCR POSITIVE (*)    All other components within normal limits  COMPREHENSIVE METABOLIC PANEL - Abnormal; Notable for the following components:    Glucose, Bld 107 (*)    Creatinine, Ser 1.37 (*)    All other components within normal limits  CBC WITH DIFFERENTIAL/PLATELET  LIPASE, BLOOD    Notable for flu positivie   Medicines ordered and prescription drug management: Meds ordered this encounter  Medications   acetaminophen (TYLENOL) tablet 650 mg   ondansetron (ZOFRAN-ODT) disintegrating tablet 4 mg   ondansetron (ZOFRAN) 4 MG tablet    Sig: Take 1 tablet (4 mg total) by mouth every 8 (eight) hours as needed for nausea or vomiting.    Dispense:  12 tablet    Refill:  0   ibuprofen (ADVIL) tablet 600 mg    -I have reviewed the patients home medicines and have made adjustments as needed  Reevaluation: After the interventions noted above, I reevaluated the patient and found that they have improved  Co morbidities that complicate the patient evaluation No past medical history on file.    Dispostion: Disposition decision including need for hospitalization was considered, and patient discharged from emergency department.    Final Clinical Impression(s) / ED Diagnoses Final diagnoses:  Influenza     This chart was dictated using voice recognition software.  Despite best efforts to proofread,  errors can occur which can change the documentation meaning.    Cristie Hem, MD 05/27/22 [phone]

## 2022-05-27 NOTE — ED Provider Triage Note (Signed)
Emergency Medicine Provider Triage Evaluation Note  Charles Atkinson , a 27 y.o. male  was evaluated in triage.  Pt complains of 3 days of bodyaches, fevers, chills, nausea vomiting diarrhea, congestion.  No shortness of breath or chest pain.  No history of same.  No known sick contacts..  Review of Systems  Positive: As above Negative: As above  Physical Exam  BP 131/82   Pulse 94   Temp (!) 101.5 F (38.6 C) (Oral)   Resp 18   SpO2 97%  Gen:   Awake, no distress   Resp:  Normal effort  MSK:   Moves extremities without difficulty  Other:  RRR no m/r/g, lungs CTAB abdomen soft, nondistended, nontender.   Medical Decision Making  Medically screening exam initiated at 1:48 AM.  Appropriate orders placed.  Charles Atkinson was informed that the remainder of the evaluation will be completed by another provider, this initial triage assessment does not replace that evaluation, and the importance of remaining in the ED until their evaluation is complete.  This chart was dictated using voice recognition software, Dragon. Despite the best efforts of this provider to proofread and correct errors, errors may still occur which can change documentation meaning.    Emeline Darling, PA-C 05/27/22 0153

## 2022-05-27 NOTE — Discharge Instructions (Signed)
We evaluated you for your body aches.  Your flu test is positive.  Please take Tylenol and Motrin for your symptoms at home.  You can take 650 mg of Tylenol every 6 hours and 600 mg of ibuprofen every 6 hours as needed for your symptoms.  You can take these medicines together as needed, either at the same time, or alternating every 3 hours.

## 2022-09-08 ENCOUNTER — Emergency Department (HOSPITAL_COMMUNITY)
Admission: EM | Admit: 2022-09-08 | Discharge: 2022-09-08 | Disposition: A | Discharge status: Home / Self Care | Payer: 59 | Attending: Emergency Medicine | Admitting: Emergency Medicine

## 2022-09-08 ENCOUNTER — Other Ambulatory Visit: Payer: Self-pay

## 2022-09-08 ENCOUNTER — Emergency Department (HOSPITAL_COMMUNITY): Payer: 59

## 2022-09-08 ENCOUNTER — Encounter (HOSPITAL_COMMUNITY): Payer: Self-pay | Admitting: Pharmacy Technician

## 2022-09-08 DIAGNOSIS — M79641 Pain in right hand: Secondary | ICD-10-CM | POA: Diagnosis present

## 2022-09-08 DIAGNOSIS — W228XXA Striking against or struck by other objects, initial encounter: Secondary | ICD-10-CM | POA: Diagnosis not present
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
## 2022-09-08 NOTE — Discharge Instructions (Signed)
You were evaluated today for right-sided hand pain.  Your x-rays were reassuring for no signs of fracture or dislocation.  Please use the brace if you find it beneficial.  Please take over-the-counter medications for inflammation such as ibuprofen or naproxen.  Follow-up as needed with hand surgery.
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
## 2022-11-09 IMAGING — DX DG CHEST 1V PORT
2 series · 2 of 2 positions shown · non-contrast
Comparison: 03/16/2019.

CLINICAL DATA: Fever and body aches.

EXAM:
PORTABLE CHEST 1 VIEW

[chest ap (1 of 2)]
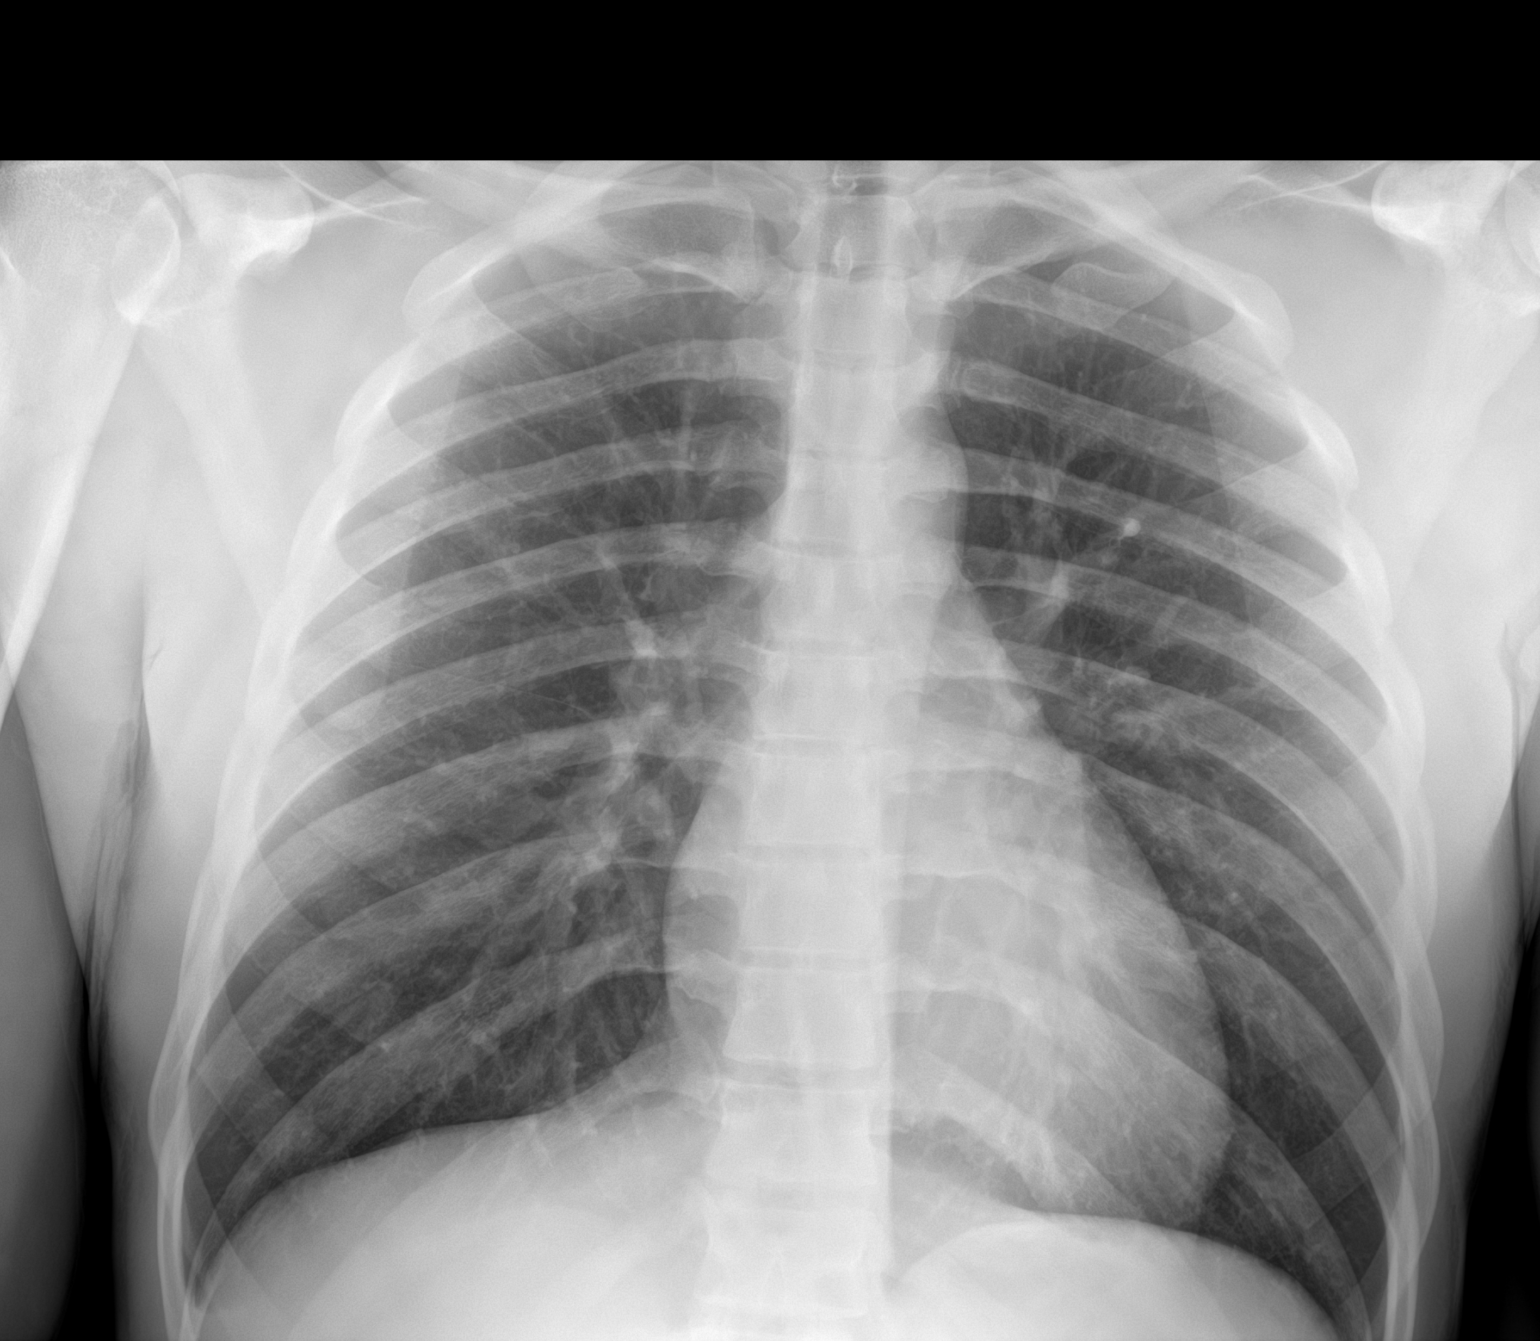

[chest ap (2 of 2)]
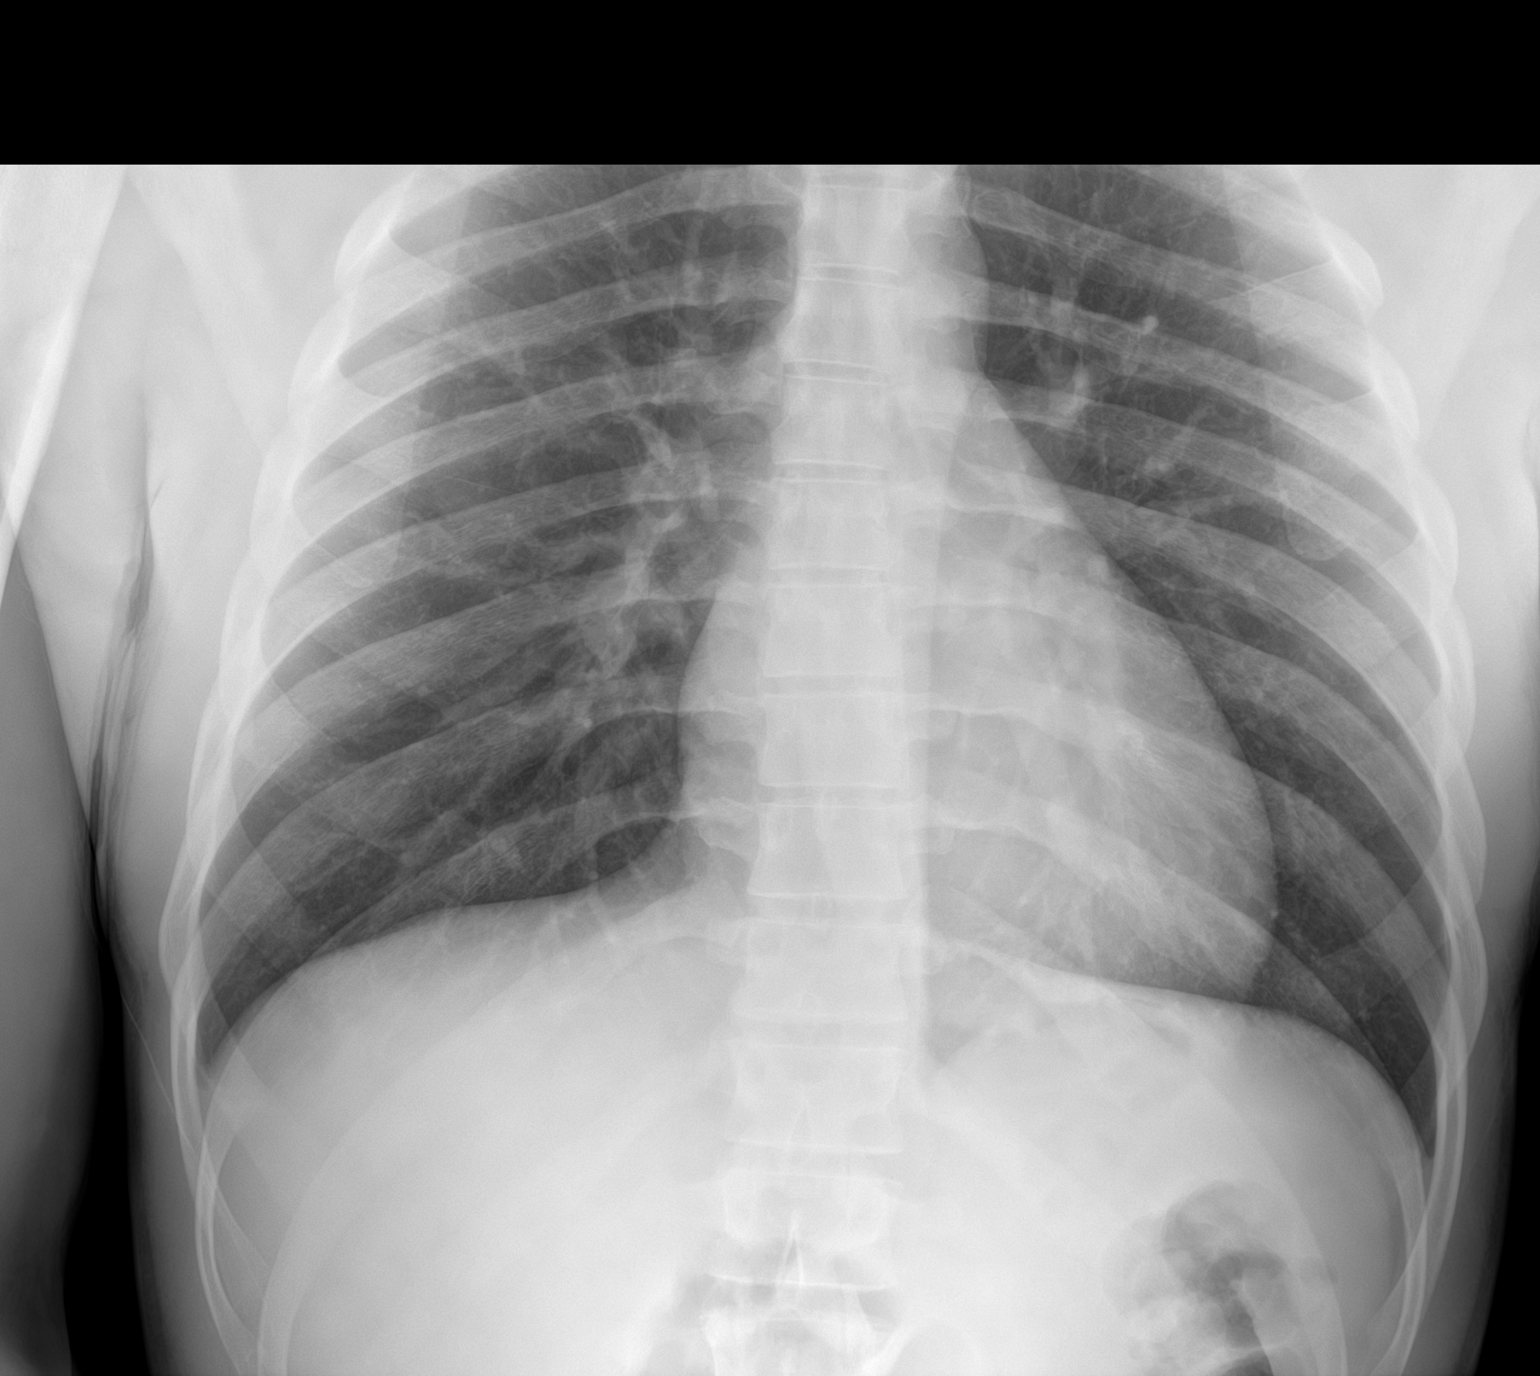

[2 of 2 positions shown; findings below may reference images not displayed]

FINDINGS: The heart size and mediastinal contours are within normal limits.
Both lungs are clear. The visualized skeletal structures are
unremarkable.
IMPRESSION: No acute cardiopulmonary disease.

## 2023-01-27 ENCOUNTER — Encounter (HOSPITAL_COMMUNITY): Payer: Self-pay

## 2023-01-27 ENCOUNTER — Emergency Department (HOSPITAL_COMMUNITY)
Admission: EM | Admit: 2023-01-27 | Discharge: 2023-01-28 | Disposition: A | Discharge status: Home / Self Care | Payer: 59 | Attending: Emergency Medicine | Admitting: Emergency Medicine

## 2023-01-27 ENCOUNTER — Other Ambulatory Visit: Payer: Self-pay

## 2023-01-27 DIAGNOSIS — Z1152 Encounter for screening for COVID-19: Secondary | ICD-10-CM | POA: Insufficient documentation

## 2023-01-27 DIAGNOSIS — J029 Acute pharyngitis, unspecified: Secondary | ICD-10-CM | POA: Diagnosis present

## 2023-01-27 DIAGNOSIS — J039 Acute tonsillitis, unspecified: Secondary | ICD-10-CM | POA: Insufficient documentation

## 2023-01-27 LAB — SARS CORONAVIRUS 2 BY RT PCR: SARS Coronavirus 2 by RT PCR: NEGATIVE

## 2023-01-27 LAB — GROUP A STREP BY PCR: Group A Strep by PCR: NOT DETECTED

## 2023-01-27 NOTE — ED Triage Notes (Signed)
Pt coming in via POV w/ c/o sore throat. Pt states "I know I have strep throat. I saw a white spot back there and I get strep all the time." Pt states it started a couple days ago, but he woke up today with increasing pain and difficulty swallowing. States he took sore throat medication OTC but it has no helped. Denies CP, SOB, N/V/D.

## 2023-01-27 NOTE — ED Provider Triage Note (Signed)
Emergency Medicine Provider Triage Evaluation Note  Charles Atkinson , a 27 y.o. male  was evaluated in triage.  Pt complains of sore throat.  Patient states he has had sore throat for the past couple of days.  Also reports nasal congestion as well as dry cough over the same time.  Has tried over-the-counter medications but without significant improvement.  Patient states to get strep throat at least once or twice a year and states this feels similar..  Review of Systems  Positive: See above Negative:   Physical Exam  BP 123/77 (BP Location: Right Arm)   Pulse 76   Temp 98.4 F (36.9 C) (Oral)   Resp 18   Ht 6\' 2"  (1.88 m)   Wt 79.8 kg   SpO2 97%   BMI 22.60 kg/m  Gen:   Awake, no distress   Resp:  Normal effort  MSK:   Moves extremities without difficulty  Other:  Posterior pharyngeal erythema.  Tonsils 2+ bilaterally with exudate appreciated on left tonsil.  Uvula midline and symmetric with phonation.  No sublingual submandibular swelling.  Medical Decision Making  Medically screening exam initiated at 8:22 PM.  Appropriate orders placed.  Charles Atkinson was informed that the remainder of the evaluation will be completed by another provider, this initial triage assessment does not replace that evaluation, and the importance of remaining in the ED until their evaluation is complete.     Peter Garter, [place] 01/27/23 2023

## 2023-01-28 MED ORDER — AMOXICILLIN 500 MG PO CAPS: 500.0000 mg | ORAL_CAPSULE | Freq: Three times a day (TID) | ORAL | 0 refills | Status: DC

## 2023-01-28 NOTE — ED Provider Notes (Signed)
  [facility] Emergency Department Provider Note MRN:  [id number]  Arrival date & time: 01/28/23     Chief Complaint   Sore Throat   History of Present Illness   Charles Atkinson is a 27 y.o. year-old male presents to the ED with chief complaint of sore throat for the past few days.  Reports swollen lymph nodes, exudates, and painful swallowing. No treatments PTA.  Denies any other associated symptoms.  History provided by patient.   Review of Systems  Pertinent positive and negative review of systems noted in HPI.    Physical Exam   Vitals:   01/27/23 1959 01/28/23 0200  BP: 123/77 105/72  Pulse: 76   Resp: 18 18  Temp: 98.4 F (36.9 C) 98.1 F (36.7 C)  SpO2: 97% 98%    CONSTITUTIONAL:  well-appearing, NAD NEURO:  Alert and oriented x 3, CN 3-12 grossly intact EYES:  eyes equal and reactive ENT/NECK:  Supple, no stridor, tonsillar exudates and erythema, no evidence of peritonsillar abscess CARDIO:  appears well-perfused  PULM:  No respiratory distress,  GI/GU:  non-distended,  MSK/SPINE:  No gross deformities, no edema, moves all extremities  SKIN:  no rash, atraumatic   *Additional and/or pertinent findings included in MDM below  Diagnostic and Interventional Summary    EKG Interpretation Date/Time:    Ventricular Rate:    PR Interval:    QRS Duration:    QT Interval:    QTC Calculation:   R Axis:      Text Interpretation:         Labs Reviewed  GROUP A STREP BY PCR  SARS CORONAVIRUS 2 BY RT PCR    No orders to display    Medications - No data to display   Procedures  /  Critical Care Procedures  ED Course and Medical Decision Making  I have reviewed the triage vital signs, the nursing notes, and pertinent available records from the EMR.  Social Determinants Affecting Complexity of Care: Patient has no clinically significant social determinants affecting this chief complaint..   ED Course:    Medical Decision  Making Risk Prescription drug management.         Consultants: No consultations were needed in caring for this patient.   Treatment and Plan: Emergency department workup does not suggest an emergent condition requiring admission or immediate intervention beyond  what has been performed at this time. The patient is safe for discharge and has  been instructed to return immediately for worsening symptoms, change in  symptoms or any other concerns    Final Clinical Impressions(s) / ED Diagnoses     ICD-10-CM   1. Tonsillitis  J03.90       ED Discharge Orders          Ordered    amoxicillin (AMOXIL) 500 MG capsule  3 times daily        01/28/23 0300              Discharge Instructions Discussed with and Provided to Patient:   Discharge Instructions   None      Roxy Horseman, PA-C 01/28/23 0302    Shon Baton, MD 01/28/23 [phone]

## 2023-11-02 ENCOUNTER — Other Ambulatory Visit: Payer: Self-pay

## 2023-11-02 ENCOUNTER — Emergency Department (HOSPITAL_COMMUNITY)
Admission: EM | Admit: 2023-11-02 | Discharge: 2023-11-02 | Disposition: A | Discharge status: Home / Self Care | Attending: Emergency Medicine | Admitting: Emergency Medicine

## 2023-11-02 ENCOUNTER — Encounter (HOSPITAL_COMMUNITY): Payer: Self-pay | Admitting: *Deleted

## 2023-11-02 ENCOUNTER — Emergency Department (HOSPITAL_COMMUNITY)

## 2023-11-02 DIAGNOSIS — W228XXA Striking against or struck by other objects, initial encounter: Secondary | ICD-10-CM | POA: Insufficient documentation

## 2023-11-02 DIAGNOSIS — S6991XA Unspecified injury of right wrist, hand and finger(s), initial encounter: Secondary | ICD-10-CM | POA: Diagnosis present

## 2023-11-02 DIAGNOSIS — M7989 Other specified soft tissue disorders: Secondary | ICD-10-CM | POA: Diagnosis not present

## 2023-11-02 DIAGNOSIS — S60011A Contusion of right thumb without damage to nail, initial encounter: Secondary | ICD-10-CM | POA: Insufficient documentation

## 2023-11-02 DIAGNOSIS — S6990XA Unspecified injury of unspecified wrist, hand and finger(s), initial encounter: Secondary | ICD-10-CM

## 2023-11-02 MED ORDER — IBUPROFEN 600 MG PO TABS: 600.0000 mg | ORAL_TABLET | Freq: Four times a day (QID) | ORAL | 0 refills | Status: DC | PRN

## 2023-11-02 MED ORDER — NAPROXEN 250 MG PO TABS
500.0000 mg | ORAL_TABLET | Freq: Once | ORAL | Status: AC
  Administered 2023-11-02: 500 mg via ORAL
  Filled 2023-11-02: qty 2

## 2023-11-02 NOTE — Discharge Instructions (Addendum)
 You were seen in the ER for thumb pain and trauma.  The x-ray of your thumb does not show any evidence of fracture.  However, you have significant swelling to the thumb and also have tenderness at the base of the thumb.  We recommend RICE treatment that is provided.  Please follow-up with orthopedic hand surgery in 2 weeks.  If you are feeling completely well by then, then you may cancel the appointment.

## 2023-11-02 NOTE — ED Triage Notes (Signed)
 The pt injured his rt thumb whenever he struck a wall last pm  sl swelling

## 2023-11-02 NOTE — ED Provider Notes (Signed)
 [name] EMERGENCY DEPARTMENT AT [facility] Provider Note   CSN: [id number] Arrival date & time: 11/02/23  1846     Patient presents with: thumb   Charles Atkinson is a 29 y.o. male.   HPI    28 year old male comes in with chief complaint of thumb injury.  Patient states that he had punched a wall yesterday.  Ever since then, has been having pain and swelling to the right thumb.  Pain is worse with any activity.  He denies injury elsewhere.  Prior to Admission medications   Medication Sig Start Date End Date Taking? Authorizing Provider  ibuprofen  (ADVIL ) 600 MG tablet Take 1 tablet (600 mg total) by mouth every 6 (six) hours as needed. 11/02/23  Yes Charlyn Sora, MD  acetaminophen  (TYLENOL ) 325 MG tablet Take 2 tablets (650 mg total) by mouth every 6 (six) hours as needed. 03/11/22   Elnor Jayson LABOR, DO  amoxicillin  (AMOXIL ) 500 MG capsule Take 1 capsule (500 mg total) by mouth 3 (three) times daily. 01/28/23   Vicky Charleston, PA-C  cyclobenzaprine  (FLEXERIL ) 10 MG tablet Take 1 tablet (10 mg total) by mouth 2 (two) times daily as needed for muscle spasms. 01/20/22   Nivia Colon, PA-C  ondansetron  (ZOFRAN ) 4 MG tablet Take 1 tablet (4 mg total) by mouth every 8 (eight) hours as needed for nausea or vomiting. 05/27/22   Francesca Elsie CROME, MD    Allergies: Patient has no known allergies.    Review of Systems  All other systems reviewed and are negative.   Updated Vital Signs BP (!) 146/76 (BP Location: Right Arm)   Pulse 86   Temp 97.9 F (36.6 C) (Oral)   Resp 16   Ht 6' 2 (1.88 m)   Wt 86.2 kg   SpO2 100%   BMI 24.39 kg/m   Physical Exam Vitals and nursing note reviewed.  Constitutional:      Appearance: He is well-developed.  HENT:     Head: Atraumatic.  Cardiovascular:     Rate and Rhythm: Normal rate.  Pulmonary:     Effort: Pulmonary effort is normal.  Musculoskeletal:        General: Swelling and tenderness present.     Cervical back:  Neck supple.     Comments: Patient's right thumb is edematous, with tenderness over the IP joint. He has mild tenderness over the anatomical snuffbox.  Patient unable to flex over the thumb IP joint. Following range of motion's were limited: Opposition and adduction  Skin:    General: Skin is warm.  Neurological:     Mental Status: He is alert and oriented to person, place, and time.     (all labs ordered are listed, but only abnormal results are displayed) Labs Reviewed - No data to display  EKG: None  Radiology: DG Finger Thumb Right Result Date: 11/02/2023 CLINICAL DATA:  Right thumb injury EXAM: RIGHT THUMB 2+V COMPARISON:  09/08/2022 FINDINGS: There is no evidence of fracture or dislocation. There is no evidence of arthropathy or other focal bone abnormality. Soft tissues are unremarkable. IMPRESSION: Negative. Electronically Signed   By: Luke Bun M.D.   On: 11/02/2023 19:34     Procedures   Medications Ordered in the ED  naproxen  (NAPROSYN ) tablet 500 mg (has no administration in time range)  Medical Decision Making Risk Prescription drug management.   28 year old male comes in with chief complaint of thumb injury.  Differential diagnosis for him includes fracture of the thumb, sprain-strain of the thumb, ligamentous injury.  He does have mild tenderness over the anatomical snuffbox, but the major discomfort is over the IP joint.  X-ray of the thumb was ordered and independently interpreted by me.  There is no evidence of fracture.  Per radiologist, there is no break either.  We will put patient in Velcro splint and recommend RICE therapy.  If patient does not improve, then we will follow-up with orthopedic surgeon about 2 weeks  Final diagnoses:  Thumb injury, initial encounter  Contusion of right thumb without damage to nail, initial encounter    ED Discharge Orders          Ordered    ibuprofen  (ADVIL ) 600 MG tablet   Every 6 hours PRN        11/02/23 2215               Charlyn Sora, MD 11/02/23 2218

## 2023-11-02 NOTE — ED Notes (Signed)
Pt discharged. Pt given discharge papers and papers explained. Pt in NAD at this time

## 2023-11-03 NOTE — Progress Notes (Signed)
 Orthopedic Tech Progress Note Patient Details:  Charles Atkinson 04-19-1996 [id number]  Ortho Devices Type of Ortho Device: Thumb velcro splint Ortho Device/Splint Location: rue Ortho Device/Splint Interventions: Ordered, Application   Post Interventions Patient Tolerated: Well Instructions Provided: Care of device, Adjustment of device  Chandra Dorn PARAS 11/03/2023, 4:59 AM

## 2023-11-14 ENCOUNTER — Encounter: Payer: Self-pay | Admitting: Orthopedic Surgery

## 2023-11-14 ENCOUNTER — Other Ambulatory Visit (INDEPENDENT_AMBULATORY_CARE_PROVIDER_SITE_OTHER): Payer: Self-pay

## 2023-11-14 ENCOUNTER — Ambulatory Visit: Admitting: Orthopedic Surgery

## 2023-11-14 DIAGNOSIS — M79644 Pain in right finger(s): Secondary | ICD-10-CM | POA: Diagnosis not present

## 2023-11-14 NOTE — Progress Notes (Signed)
 Charles Atkinson - 27 y.o. male MRN [id number]  Date of birth: 1996-03-07  Office Visit Note: Visit Date: 11/14/2023 PCP: Pcp, No Referred by: No ref. provider found  Subjective: No chief complaint on file.  HPI: Charles Atkinson is a pleasant 28 y.o. male who presents today for evaluation of a right thumb injury sustained approximate 2 weeks prior.  Injury mechanism described as punching through a wall.  Is had ongoing pain at the MP region of the right thumb with notable swelling.  Was placed to a thumb spica brace in the emergency department setting.  X-rays were negative at that time.  He is right-hand dominant, overall active at baseline.  Pertinent ROS were reviewed with the patient and found to be negative unless otherwise specified above in HPI.   Visit Reason: Right thumb pain from punching through a wall Duration of symptoms: 2 weeks Hand dominance: right Occupation: ups Diabetic: No Smoking: Yes, tobacco 2 black and milds a day Heart/Lung History: none Blood Thinners: none  Prior Testing/EMG: xrays Injections (Date): none Treatments: brace, OTC pain medicine  Prior Surgery: ORIF R hand 5th metacarpal around 2016   Assessment & Plan: Visit Diagnoses:  1. Thumb pain, right     Plan: Repeat x-rays were obtained today which do not show any significant fracture or dislocation.  Based on his clinical examination today, there is significant tenderness over both the ulnar and radial aspects of the MP joint of the thumb.  There is notable laxity in both planes both radially and ulnarly.  In particular, most concerned about a possible UCL injury versus RCL injury based on his examination and injury mechanism.  I recommended that he undergo urgent MRI of the right hand and thumb in order to better delineate pathology in this region.  He states that the current thumb spica brace he is in is not providing enough support, we will transition him today to a thumb spica cast for  better immobilization at this time while we await the results of his MRI.  He will return to me after the imaging is complete to review results and discuss appropriate next treatment steps.  I spent 45 minutes in the care of this patient today including review of previous documentation, imaging obtained, face-to-face time discussing all options regarding treatment and documenting the encounter.   Follow-up: No follow-ups on file.   Meds & Orders: No orders of the defined types were placed in this encounter.   Orders Placed This Encounter  Procedures   XR Wrist Complete Right   MR HAND RIGHT W WO CONTRAST     Procedures: No procedures performed      Clinical History: No specialty comments available.  He reports that he has never smoked. He has never used smokeless tobacco. No results for input(s): HGBA1C, LABURIC in the last 8760 hours.  Objective:   Vital Signs: There were no vitals taken for this visit.  Physical Exam  Gen: Well-appearing, in no acute distress; non-toxic CV: Regular Rate. Well-perfused. Warm.  Resp: Breathing unlabored on room air; no wheezing. Psych: Fluid speech in conversation; appropriate affect; normal thought process  Ortho Exam Right hand: - Notable tenderness over the ulnar and radial aspect of the thumb MCP region, notable pain and swelling in this area as well, skin is intact - Stress testing of both neutral and 30 degrees of flexion of the thumb MCP demonstrates significant pain with radial deviation - Sensation is intact distally to the thumb  both radially and ulnarly, normal capillary refill distally - IP range of motion is well-preserved 0-45 - Thumb CMC circumduction is preserved without significant pain - Remaining digits with appropriate digital range of motion without significant restriction  Imaging: XR Wrist Complete Right Result Date: 11/14/2023 There is no evidence of fracture or dislocation. There is no evidence of arthropathy or  other focal bone abnormality. Soft tissues are unremarkable.    Past Medical/Family/Surgical/Social History: Medications & Allergies reviewed per EMR, new medications updated. There are no active problems to display for this patient.  No past medical history on file. No family history on file. Past Surgical History:  Procedure Laterality Date   HAND SURGERY Right    Social History   Occupational History   Not on file  Tobacco Use   Smoking status: Never   Smokeless tobacco: Never  Substance and Sexual Activity   Alcohol use: Yes    Alcohol/week: 5.0 standard drinks of alcohol    Types: 5 Cans of beer per week   Drug use: No   Sexual activity: Never    Saya Mccoll Estela) Arlinda, M.D. [facility], Hand Surgery

## 2023-11-14 NOTE — Addendum Note (Signed)
 Addended by: Jadelin Eng G on: 11/14/2023 11:16 AM   Modules accepted: Orders

## 2023-11-21 ENCOUNTER — Encounter: Payer: Self-pay | Admitting: Orthopedic Surgery

## 2023-12-02 ENCOUNTER — Telehealth: Payer: Self-pay

## 2023-12-02 NOTE — Telephone Encounter (Signed)
 Patient called stating that he got a code from his insurance for the MRI. Stated that he hasn't heard from our office in regards to the referral for a MRI. He is asking for a return call and his number is [phone].

## 2023-12-07 ENCOUNTER — Ambulatory Visit (HOSPITAL_COMMUNITY)
Admission: RE | Admit: 2023-12-07 | Discharge: 2023-12-07 | Disposition: A | Discharge status: Home / Self Care | Payer: Self-pay | Source: Ambulatory Visit

## 2023-12-07 ENCOUNTER — Encounter (HOSPITAL_COMMUNITY): Payer: Self-pay

## 2023-12-07 VITALS — BP 119/79 | HR 67 | Temp 98.0°F | Resp 16

## 2023-12-07 DIAGNOSIS — L723 Sebaceous cyst: Secondary | ICD-10-CM | POA: Diagnosis not present

## 2023-12-07 NOTE — ED Triage Notes (Signed)
 Patient c/o RUQ abdominal pain and states that he has a raised area and states that it is sore x 2-3 weeks. Patient states the raised area started out small and is getting bigger. Patient denies any N/v/d.  Patient denies taking any medications for his symptoms.

## 2023-12-07 NOTE — Discharge Instructions (Addendum)
 As we discussed, I believe this is a sebaceous cyst.  Generally this is not something that is a problem.  Monitor this but if it becomes larger, red, swollen, pain, fever, nausea, vomiting you need to be seen immediately.  I think it is reasonable to follow-up with a dermatologist if you would like to investigate this further.  Call them to schedule an appointment.  If you have any questions or if this changes please return so I can reevaluate you.

## 2023-12-07 NOTE — ED Provider Notes (Signed)
 [facility]    CSN: [id number] Arrival date & time: 12/07/23  1357      History   Chief Complaint Chief Complaint  Patient presents with   Abdominal Pain    Feel like  I got a tumor - Entered by patient    HPI Charles Atkinson is a 28 y.o. male.   Patient presents today with a small lesion on his right lateral ribs.  He denies any injury or recent trauma.  He reports that this is not significantly painful but has slowly been enlarging in size.  He reports some occasional soreness with palpation but generally it is not bothersome or noticeable.  He denies episodes of similar lesions in the past.  He has not had any pain, swelling, redness, fever, nausea, vomiting.  He has not tried any over-the-counter medications for symptom management.    History reviewed. No pertinent past medical history.  There are no active problems to display for this patient.   Past Surgical History:  Procedure Laterality Date   HAND SURGERY Right        Home Medications    Prior to Admission medications   Medication Sig Start Date End Date Taking? Authorizing Provider  acetaminophen  (TYLENOL ) 325 MG tablet Take 2 tablets (650 mg total) by mouth every 6 (six) hours as needed. Patient not taking: Reported on 12/07/2023 03/11/22   Elnor Savant A, DO  ibuprofen  (ADVIL ) 600 MG tablet Take 1 tablet (600 mg total) by mouth every 6 (six) hours as needed. 11/02/23   Charlyn Sora, MD    Family History History reviewed. No pertinent family history.  Social History Social History   Tobacco Use   Smoking status: Never   Smokeless tobacco: Never  Vaping Use   Vaping status: Never Used  Substance Use Topics   Alcohol use: Yes    Alcohol/week: 5.0 standard drinks of alcohol    Types: 5 Cans of beer per week   Drug use: No     Allergies   Patient has no known allergies.   Review of Systems Review of Systems  Constitutional:  Negative for activity change, appetite change,  fatigue and fever.  Gastrointestinal:  Negative for abdominal pain, blood in stool, constipation, diarrhea, nausea and vomiting.  Skin:  Negative for color change and wound.     Physical Exam Triage Vital Signs ED Triage Vitals [12/07/23 1429]  Encounter Vitals Group     BP 119/79     Girls Systolic BP Percentile      Girls Diastolic BP Percentile      Boys Systolic BP Percentile      Boys Diastolic BP Percentile      Pulse Rate 67     Resp 16     Temp 98 F (36.7 C)     Temp Source Oral     SpO2 95 %     Weight      Height      Head Circumference      Peak Flow      Pain Score 7     Pain Loc      Pain Education      Exclude from Growth Chart    No data found.  Updated Vital Signs BP 119/79 (BP Location: Left Arm)   Pulse 67   Temp 98 F (36.7 C) (Oral)   Resp 16   SpO2 95%   Visual Acuity Right Eye Distance:   Left Eye Distance:   Bilateral  Distance:    Right Eye Near:   Left Eye Near:    Bilateral Near:     Physical Exam Vitals reviewed.  Constitutional:      General: He is awake.     Appearance: Normal appearance. He is well-developed. He is not ill-appearing.     Comments: Very pleasant male appears stated age in no acute distress sitting comfortably in exam room  HENT:     Head: Normocephalic and atraumatic.  Cardiovascular:     Rate and Rhythm: Normal rate and regular rhythm.     Heart sounds: Normal heart sounds, S1 normal and S2 normal. No murmur heard. Pulmonary:     Effort: Pulmonary effort is normal.     Breath sounds: Normal breath sounds. No stridor. No wheezing, rhonchi or rales.     Comments: Clear to auscultation bilaterally Chest:     Chest wall: No deformity, swelling or tenderness.  Abdominal:     General: Bowel sounds are normal.     Palpations: Abdomen is soft.     Tenderness: There is no abdominal tenderness. There is no right CVA tenderness, left CVA tenderness, guarding or rebound.     Comments: Benign abdominal exam.   Skin:    Findings: Lesion present.         Comments: 1 cm x 1 cm nodule noted right lateral ribs that is well-defined, mobile, without fluctuance or significant induration.  No overlying erythema.  No bleeding or drainage noted.  Neurological:     Mental Status: He is alert.  Psychiatric:        Behavior: Behavior is cooperative.      UC Treatments / Results  Labs (all labs ordered are listed, but only abnormal results are displayed) Labs Reviewed - No data to display  EKG   Radiology No results found.  Procedures Procedures (including critical care time)  Medications Ordered in UC Medications - No data to display  Initial Impression / Assessment and Plan / UC Course  I have reviewed the triage vital signs and the nursing notes.  Pertinent labs & imaging results that were available during my care of the patient were reviewed by me and considered in my medical decision making (see chart for details).     Patient is well-appearing, afebrile, nontoxic, nontachycardic.  Lesion is consistent with sebaceous cyst and we discussed that this is generally benign and as long as it does not become infected or cause pain we do not need to intervene.  There is no evidence of infection on exam today we discussed that if he develops any rapidly enlarging lesion, fever, pain, erythema, bleeding, drainage she should return for reevaluation.  We did discuss that if this becomes bothersome we can consider referral to dermatology for evaluation and consideration of excision.  He was given the contact information for local provider.  All questions were answered to patient satisfaction.  Final Clinical Impressions(s) / UC Diagnoses   Final diagnoses:  Sebaceous cyst     Discharge Instructions      As we discussed, I believe this is a sebaceous cyst.  Generally this is not something that is a problem.  Monitor this but if it becomes larger, red, swollen, pain, fever, nausea, vomiting you  need to be seen immediately.  I think it is reasonable to follow-up with a dermatologist if you would like to investigate this further.  Call them to schedule an appointment.  If you have any questions or if this changes please  return so I can reevaluate you.    ED Prescriptions   None    PDMP not reviewed this encounter.   Sherrell Rocky POUR, PA-C 12/07/23 1502

## 2023-12-09 NOTE — Telephone Encounter (Signed)
 Pending message back from Autumn H to see ok to change the exam

## 2023-12-11 ENCOUNTER — Ambulatory Visit (INDEPENDENT_AMBULATORY_CARE_PROVIDER_SITE_OTHER): Admitting: Orthopedic Surgery

## 2023-12-11 ENCOUNTER — Encounter: Payer: Self-pay | Admitting: Orthopedic Surgery

## 2023-12-11 DIAGNOSIS — M79644 Pain in right finger(s): Secondary | ICD-10-CM | POA: Diagnosis not present

## 2023-12-11 DIAGNOSIS — M79621 Pain in right upper arm: Secondary | ICD-10-CM

## 2023-12-11 NOTE — Progress Notes (Signed)
 Haris D Leeman - 28 y.o. male MRN [id number]  Date of birth: 1995-06-30  Office Visit Note: Visit Date: 12/11/2023 PCP: Pcp, No Referred by: No ref. provider found  Subjective: No chief complaint on file.  HPI: Keiondre D Lipari is a pleasant 28 y.o. male who returns today for follow-up of a right thumb injury sustained 6 weeks prior.  He has been in a thumb spica cast for the past 4 weeks.  Has not been able to obtain the originally ordered MRI of the right hand for concern for UCL tear at this juncture.  Pertinent ROS were reviewed with the patient and found to be negative unless otherwise specified above in HPI.     Assessment & Plan: Visit Diagnoses:  1. Pain of right upper arm     Plan: Patient has undergone previous clinical and radiographic workup with concern for ulnar collateral ligament versus radial collateral ligament injury of the right thumb.  I did explain the importance of the MRI study and the urgency of this in order to better delineate potential disruption of these ligaments at the thumb MCP region.  His exam remains painful today with notable clinical instability.  He remains indicated for right hand MRI study for delineation of anatomy and potential surgical planning.  Will return after the study is complete.  He states that this has been approved by his [employer], our office will confirm today.    Follow-up: No follow-ups on file.   Meds & Orders: No orders of the defined types were placed in this encounter.   Orders Placed This Encounter  Procedures   MR BRACHIAL PLEXUS W WO CM     Procedures: No procedures performed      Clinical History: No specialty comments available.  He reports that he has never smoked. He has never used smokeless tobacco. No results for input(s): HGBA1C, LABURIC in the last 8760 hours.  Objective:   Vital Signs: There were no vitals taken for this visit.  Physical Exam  Gen: Well-appearing, in no acute  distress; non-toxic CV: Regular Rate. Well-perfused. Warm.  Resp: Breathing unlabored on room air; no wheezing. Psych: Fluid speech in conversation; appropriate affect; normal thought process  Ortho Exam Right hand: - Notable tenderness over the ulnar and radial aspect of the thumb MCP region, notable pain and swelling in this area as well, skin is intact - Stress testing of both neutral and 30 degrees of flexion of the thumb MCP demonstrates significant pain with radial deviation, notable laxity in comparison to contralateral side - Sensation is intact distally to the thumb both radially and ulnarly, normal capillary refill distally - IP range of motion is well-preserved 0-45 - Thumb CMC circumduction is preserved without significant pain - Remaining digits with appropriate digital range of motion without significant restriction  Imaging: No results found.   Past Medical/Family/Surgical/Social History: Medications & Allergies reviewed per EMR, new medications updated. There are no active problems to display for this patient.  No past medical history on file. No family history on file. Past Surgical History:  Procedure Laterality Date   HAND SURGERY Right    Social History   Occupational History   Not on file  Tobacco Use   Smoking status: Never   Smokeless tobacco: Never  Vaping Use   Vaping status: Never Used  Substance and Sexual Activity   Alcohol use: Yes    Alcohol/week: 5.0 standard drinks of alcohol    Types: 5 Cans of beer  per week   Drug use: No   Sexual activity: Never    Devinn Voshell Estela) Prisha Hiley, M.D. [facility], Hand Surgery

## 2023-12-12 ENCOUNTER — Encounter: Payer: Self-pay | Admitting: Orthopedic Surgery

## 2023-12-12 ENCOUNTER — Telehealth: Payer: Self-pay

## 2023-12-12 NOTE — Telephone Encounter (Signed)
 MRI is for tomorrow. I scheduled him to see Dr erwin on wed. Please look out for the report

## 2023-12-12 NOTE — Addendum Note (Signed)
 Addended by: VANDERBILT LIONEL CROME on: 12/12/2023 08:27 AM   Modules accepted: Orders

## 2023-12-12 NOTE — Telephone Encounter (Signed)
 LVM for pt to cb to work in for next week

## 2023-12-13 ENCOUNTER — Ambulatory Visit
Admission: RE | Admit: 2023-12-13 | Discharge: 2023-12-13 | Disposition: A | Discharge status: Home / Self Care | Source: Ambulatory Visit | Attending: Orthopedic Surgery | Admitting: Orthopedic Surgery

## 2023-12-13 ENCOUNTER — Inpatient Hospital Stay: Admission: RE | Admit: 2023-12-13 | Source: Ambulatory Visit

## 2023-12-13 DIAGNOSIS — M79644 Pain in right finger(s): Secondary | ICD-10-CM

## 2023-12-17 ENCOUNTER — Ambulatory Visit (INDEPENDENT_AMBULATORY_CARE_PROVIDER_SITE_OTHER): Admitting: Orthopedic Surgery

## 2023-12-17 DIAGNOSIS — M79644 Pain in right finger(s): Secondary | ICD-10-CM

## 2023-12-17 NOTE — Progress Notes (Unsigned)
    Charles Atkinson - 28 y.o. male MRN [id number]  Date of birth: [birth year]  Office Visit Note: Visit Date: 12/17/2023 PCP: Pcp, No Referred by: No ref. provider found  Subjective: Chief Complaint  Patient presents with   Right Hand - Pain    MRI REVIEW   HPI: Charles Atkinson is a pleasant 28 y.o. male who returns today for follow-up of a right thumb injury sustained 7 weeks prior.  He has been in a thumb spica cast for the past 4 weeks.  Underwent MRI of the right hand which showed partial tear of the ulnar collateral ligament without findings to suggest complete tear or rupture.  No Stener lesion.  Remains comfortable in the cast.  Pertinent ROS were reviewed with the patient and found to be negative unless otherwise specified above in HPI.     Assessment & Plan: Visit Diagnoses:  No diagnosis found.   Plan: Based on his MRI, we can continue with nonoperative care given the high-grade sprain versus partial tear.  I did explain that should he have ongoing instability or pain in the future, we could always consider reconstruction of the ulnar collateral ligament as needed.  For the time being, maintain thumb spica cast for additional 2 weeks.  At that juncture we will likely transition to removable brace and begin therapeutic exercises.  He expressed full understanding.  MRI was reviewed in detail with him today.   Follow-up: No follow-ups on file.   Meds & Orders: No orders of the defined types were placed in this encounter.   No orders of the defined types were placed in this encounter.    Procedures: No procedures performed      Clinical History: No specialty comments available.  He reports that he has never smoked. He has never used smokeless tobacco. No results for input(s): HGBA1C, LABURIC in the last 8760 hours.  Objective:   Vital Signs: There were no vitals taken for this visit.  Physical Exam  Gen: Well-appearing, in no acute distress; non-toxic CV:  Regular Rate. Well-perfused. Warm.  Resp: Breathing unlabored on room air; no wheezing. Psych: Fluid speech in conversation; appropriate affect; normal thought process  Ortho Exam Right hand: Thumb spica cast in place, well-fitting  Imaging: No results found.   Past Medical/Family/Surgical/Social History: Medications & Allergies reviewed per EMR, new medications updated. There are no active problems to display for this patient.  No past medical history on file. No family history on file. Past Surgical History:  Procedure Laterality Date   HAND SURGERY Right    Social History   Occupational History   Not on file  Tobacco Use   Smoking status: Never   Smokeless tobacco: Never  Vaping Use   Vaping status: Never Used  Substance and Sexual Activity   Alcohol use: Yes    Alcohol/week: 5.0 standard drinks of alcohol    Types: 5 Cans of beer per week   Drug use: No   Sexual activity: Never    Saraiah Bhat Estela) Arlinda, M.D. [facility], Hand Surgery

## 2023-12-31 ENCOUNTER — Ambulatory Visit (INDEPENDENT_AMBULATORY_CARE_PROVIDER_SITE_OTHER): Admitting: Orthopedic Surgery

## 2023-12-31 DIAGNOSIS — M79644 Pain in right finger(s): Secondary | ICD-10-CM | POA: Diagnosis not present

## 2023-12-31 NOTE — Progress Notes (Unsigned)
    Charles Atkinson - 28 y.o. male MRN [id number]  Date of birth: 1996-04-09  Office Visit Note: Visit Date: 12/31/2023 PCP: Pcp, No Referred by: No ref. provider found  Subjective: No chief complaint on file.  HPI: Charles Atkinson is a pleasant 28 y.o. male who returns today for follow-up of a right thumb injury sustained 9 weeks prior.  He has been in a thumb spica cast for the past 4 weeks.  Underwent MRI of the right hand which showed partial tear of the ulnar collateral ligament without findings to suggest complete tear or rupture.  No Stener lesion.  .  Pertinent ROS were reviewed with the patient and found to be negative unless otherwise specified above in HPI.     Assessment & Plan: Visit Diagnoses:  1. Thumb pain, right      Plan: Based on his MRI, we can continue with nonoperative care given the high-grade sprain versus partial tear.  He was transition to a removable thumb spica Exos cast today and can begin gentle range of motion exercises of the thumb.  Refrain from strengthening.  Follow-up in 3 to 4 weeks.   Follow-up: No follow-ups on file.   Meds & Orders: No orders of the defined types were placed in this encounter.   Orders Placed This Encounter  Procedures   Ambulatory referral to Occupational Therapy     Procedures: No procedures performed      Clinical History: No specialty comments available.  He reports that he has never smoked. He has never used smokeless tobacco. No results for input(s): HGBA1C, LABURIC in the last 8760 hours.  Objective:   Vital Signs: There were no vitals taken for this visit.  Physical Exam  Gen: Well-appearing, in no acute distress; non-toxic CV: Regular Rate. Well-perfused. Warm.  Resp: Breathing unlabored on room air; no wheezing. Psych: Fluid speech in conversation; appropriate affect; normal thought process  Ortho Exam Right hand: Thumb spica cast removed today - Minimal pain elicited over the UCL region  with palpation, stable endpoint with stress testing at both neutral and 30 degrees of flexion at the MCP joint  Imaging: No results found.   Past Medical/Family/Surgical/Social History: Medications & Allergies reviewed per EMR, new medications updated. There are no active problems to display for this patient.  No past medical history on file. No family history on file. Past Surgical History:  Procedure Laterality Date   HAND SURGERY Right    Social History   Occupational History   Not on file  Tobacco Use   Smoking status: Never   Smokeless tobacco: Never  Vaping Use   Vaping status: Never Used  Substance and Sexual Activity   Alcohol use: Yes    Alcohol/week: 5.0 standard drinks of alcohol    Types: 5 Cans of beer per week   Drug use: No   Sexual activity: Never    Anshul Estela) Arlinda, M.D.  [facility], Hand [facility]

## 2024-01-20 ENCOUNTER — Encounter: Payer: Self-pay | Admitting: Occupational Therapy

## 2024-01-20 ENCOUNTER — Other Ambulatory Visit: Payer: Self-pay

## 2024-01-20 ENCOUNTER — Ambulatory Visit: Attending: Orthopedic Surgery | Admitting: Occupational Therapy

## 2024-01-20 DIAGNOSIS — R29898 Other symptoms and signs involving the musculoskeletal system: Secondary | ICD-10-CM | POA: Insufficient documentation

## 2024-01-20 DIAGNOSIS — M79644 Pain in right finger(s): Secondary | ICD-10-CM | POA: Diagnosis present

## 2024-01-20 DIAGNOSIS — M6281 Muscle weakness (generalized): Secondary | ICD-10-CM | POA: Insufficient documentation

## 2024-01-20 DIAGNOSIS — M25641 Stiffness of right hand, not elsewhere classified: Secondary | ICD-10-CM | POA: Insufficient documentation

## 2024-01-20 DIAGNOSIS — R278 Other lack of coordination: Secondary | ICD-10-CM | POA: Diagnosis present

## 2024-01-20 NOTE — Patient Instructions (Signed)
 [name]

## 2024-01-20 NOTE — Therapy (Unsigned)
 OUTPATIENT OCCUPATIONAL THERAPY ORTHO EVALUATION  Patient Name: Charles Atkinson MRN: [id number] DOB:1995/05/03, 28 y.o., male Today's Date: 01/20/2024  PCP: No PCP REFERRING PROVIDER: Arlinda Buster, MD  END OF SESSION:  OT End of Session - 01/20/24 0851     Visit Number 1    Number of Visits 16    Date for Recertification  03/26/24    Authorization Type [employer]/ [facility] Medicaid 2025 Covered 100%    Authorization Time Period VL: 60 NO AUTH REQUIRED    OT Start Time 0850    OT Stop Time 0930    OT Time Calculation (min) 40 min    Equipment Utilized During Treatment Testing Material    Activity Tolerance Patient tolerated treatment well    Behavior During Therapy WFL for tasks assessed/performed          History reviewed. No pertinent past medical history. Past Surgical History:  Procedure Laterality Date   HAND SURGERY Right    There are no active problems to display for this patient.   ONSET DATE: 12/31/2023; 2.5 - 3 months, 11/02/23 onset  REFERRING DIAG: M79.644 (ICD-10-CM) - Thumb pain, right Dx per MRI: partial tear of the ulnar collateral ligament   THERAPY DIAG:  Muscle weakness (generalized)  Pain of right thumb  Other lack of coordination  Other symptoms and signs involving the musculoskeletal system  Rationale for Evaluation and Treatment: Rehabilitation  SUBJECTIVE:   SUBJECTIVE STATEMENT: Pt went to the ED 11/02/23 s/p thumb injury after he punched a wall the day before. Ever since then, he has been having pain and swelling to the right thumb. Pt had a spica splint initially and then a hard cast ~ 2 months and was transitioned back to the spica splint after his last MD appt.  Pt reports he is unable to grab stuff ie) can't squeeze anything and can't participate in sports - basketball/football or work out ie) previously went to the gym and lifted weights.  Per MD appt 12/31/23: Plan: Based on his MRI, we can continue with nonoperative care given the  high-grade sprain versus partial tear.  He was transition to a removable thumb spica Exos cast today and can begin gentle range of motion exercises of the thumb.  Refrain from strengthening.  Follow-up in 3 to 4 weeks.   Pt accompanied by: self  PERTINENT HISTORY: PMHx: N/A except for R hand fracture with pins when he was a teenager > 10 years ago  Underwent MRI of the right hand which showed partial tear of the ulnar collateral ligament without findings to suggest complete tear or rupture. No Stener lesion. Charles Atkinson   PRECAUTIONS: Other: no lifting or straining R hand Per MD: Refrain from strengthening at this time.   RED FLAGS: None   WEIGHT BEARING RESTRICTIONS: Yes RUE limitations  PAIN:  Are you having pain? Yes: NPRS scale: 8/10 at worst; fine at rest Pain location: base of thumb Pain description: sharp pains Aggravating factors: moving it a certain way (outside of splint) Relieving factors: splint and over the counter Advil   FALLS: Has patient fallen in last 6 months? 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  HAND DOMINANCE: Right  ADLs: WFL  FUNCTIONAL OUTCOME MEASURES: Quick Dash: 38.6  UPPER EXTREMITY ROM:     Active ROM Right eval Left eval  Shoulder flexion    Shoulder abduction    Shoulder adduction    Shoulder extension    Shoulder internal rotation    Shoulder external rotation    Elbow flexion    Elbow extension    Wrist flexion 70 80  Wrist extension 64 64  Wrist ulnar deviation    Wrist radial deviation    Wrist  pronation    Wrist supination    (Blank rows = not tested)  Active ROM Right eval Left eval  Thumb MCP (0-60) 27 64  Thumb IP (0-80) 60 80  Thumb Radial abd/add (0-55)     Thumb Palmar abd/add (0-45)     Thumb Opposition to Small Finger Stiff    Index MCP (0-90)     Index PIP (0-100)     Index DIP (0-70)      Long MCP (0-90)      Long PIP (0-100)      Long DIP (0-70)      Ring MCP (0-90)      Ring PIP (0-100)      Ring DIP (0-70)      Little MCP (0-90)      Little PIP (0-100)      Little DIP (0-70)      (Blank rows = not tested)   UPPER EXTREMITY MMT:     MMT Right eval Left eval  Shoulder flexion 5 5  Shoulder abduction    Shoulder adduction    Shoulder extension    Shoulder internal rotation    Shoulder external rotation    Middle trapezius    Lower trapezius    Elbow flexion 5 5  Elbow extension 5 5  Wrist flexion    Wrist extension    Wrist ulnar deviation    Wrist radial deviation    Wrist pronation    Wrist supination    (Blank rows = not tested)  HAND FUNCTION: Grip strength: Right: 8.5, 9.9, 11.6 lbs; Left: 64.3, 63.9, 70.9 lbs Eval 01/20/24 Average Right: 10.0 lbs; Left 66.4 lbs  COORDINATION: 9 Hole Peg test: Right: 25.32 sec; Left: 30.04 sec  SENSATION: WFL  EDEMA: little bit around thumb  COGNITION: Overall cognitive status: Within functional limits for tasks assessed  OBSERVATIONS: Pt ambulates with no AE and no balance deficits. Pt arrived with thumb spica Exos applied to RUE.  Evaluation was on his birthday and pt is well kept but with limited mobility and strength in dominant RUE.   TODAY'S TREATMENT:                                                                                                                            - Therapeutic exercises completed for duration as noted below including:  Pt issued tendon gliding exercises/handout with review of motions to isolate DIP, PIP and MCP joints for straight finger position, hook  (DIP/PIP flexion), fist (DIP/PIP/MCP flexion), taco/duck (MCP flexion only) and flat fist (MCP and PIP flexion). Education provided on purpose of tendon glide exercises ie) to increase the circulation to the hand and wrist, reduce swelling and promote healthier soft tissue for increased AROM and not to build hand or wrist strength at this time.  Pt issued thumb exercises/handout with review of motions from [place]  Protocol with pt encouraged to move within his own muscle power and then progress to gentle stretch before full passive stretching can occur.  ROM includes thumb flexion/extension, adduction/abduction and opposition with addition of thumb slides to base of fingers.  PATIENT EDUCATION: Education details: OT role and POC Considerations & Tendon glide and Thumb ROM Person educated: Patient Education method: Explanation, Demonstration, Tactile cues, Verbal cues, and Handouts Education comprehension: verbalized understanding, returned demonstration, verbal cues required, tactile cues required, and needs further education  HOME EXERCISE PROGRAM: 01/20/24: Tendon glides and Thumb ROM ([place]  Protocol)  GOALS: Goals reviewed with patient? Yes    SHORT TERM GOALS: Target date: 02/20/24   Patient will demonstrate initial RUE HEP with 25% verbal cues or less for proper execution. Baseline: New to outpt OT - splint/cast since injury in July 2025 Goal status: IN Progress - Tendon Gliding Exc issued at eval.    2. Patient will increase R thumb MCP/IP flexion for full tight composite fist to grasp objects without dropping items.   Baseline:   Right Left  Thumb MCP (0-60) 27 64  Thumb IP (0-80) 60 80   Goal status: IN Progress - HEP initiated at eval  3. Patient will demonstrate at least 30 lb improvement in dominant RUE grip strength as needed to open jars and other containers. Baseline: Right: 10.0 lbs; Left 66.4 lbs Goal status: INITIAL   LONG TERM GOALS: Target date: 03/26/24    Patient will demonstrate updated RUE HEP with visual instruction only for proper execution. Baseline: New to outpt OT Goal status: IN Progress - Tendon Gliding Exc issued at eval.    2.  Pt will be independent with home based pain management routine to potentially include gloves/splints, heat and joint protection principles for minimal pain <3/10 with movements. Baseline: 8/10 with movements Goal status: INITIAL  3. Pt will be able to tolerate force/impact to R hand with minimal difficulty and without substantial increase in pain with appropriate support as needed for handling packages at work, resuming sports etc.  Baseline: Unable/100% impaired per QuickDash  Goal status: INITIAL   4.  Patient will demonstrate at least 16% improvement with quick Dash score (reporting <20% disability or less) indicating improved functional use of affected extremity.  Baseline: QuickDash 38.6% Goal status: INITIAL   ASSESSMENT:  CLINICAL IMPRESSION: Patient is a 28 y.o. male who was seen today for occupational therapy evaluation for R thumb pain/sprain. Patient currently presents below baseline level of function with use of RUE, demonstrating functional deficits and impairments particularly in strength as noted below. Pt would benefit from skilled OT services in the outpatient setting to work on impairments as noted below to help pt return to PLOF as able.    PERFORMANCE DEFICITS: in functional skills including IADLs, coordination, ROM, strength, pain, fascial restrictions, muscle spasms, flexibility, Fine motor control, Gross motor control, body mechanics, endurance, decreased knowledge of precautions, and UE functional use.  IMPAIRMENTS: are limiting patient from IADLs, work, leisure, and  social participation.   COMORBIDITIES: has no other co-morbidities that affects occupational performance. Patient will benefit from skilled OT to address above impairments and improve overall function.  MODIFICATION  OR ASSISTANCE TO COMPLETE EVALUATION: No modification of tasks or assist necessary to complete an evaluation.  OT OCCUPATIONAL PROFILE AND HISTORY: Problem focused assessment: Including review of records relating to presenting problem.  CLINICAL DECISION MAKING: LOW - limited treatment options, no task modification necessary  REHAB POTENTIAL: Excellent  EVALUATION COMPLEXITY: Low      PLAN:  OT FREQUENCY: 1-2x/week  OT DURATION: 8 weeks  PLANNED INTERVENTIONS: 97168 OT Re-evaluation, 97535 self care/ADL training, 02889 therapeutic exercise, 97530 therapeutic activity, 97140 manual therapy, 97035 ultrasound, 97018 paraffin, 02960 fluidotherapy, 97010 moist heat, 97010 cryotherapy, 97034 contrast bath, 97032 electrical stimulation (manual), 97760 Orthotic Initial, [id number] Orthotic/Prosthetic subsequent, passive range of motion, coping strategies training, patient/family education, and DME and/or AE instructions  RECOMMENDED OTHER SERVICES: NA  CONSULTED AND AGREED WITH PLAN OF CARE: Patient  PLAN FOR NEXT SESSION:  Fluidotherapy HEP progression   Clarita LITTIE Pride, OT 01/20/2024, 7:45 PM

## 2024-01-22 ENCOUNTER — Ambulatory Visit: Admitting: Occupational Therapy

## 2024-01-22 ENCOUNTER — Telehealth: Payer: Self-pay | Admitting: Occupational Therapy

## 2024-01-22 NOTE — Telephone Encounter (Signed)
 This is to document my attempt to call patient after no-show for OT appt this AM.  This is patient's # 1st missed appt.   Primary phone number(s) was used in efforts to contact the patient.   Spoke to patient to remind them of upcoming therapy visit - next Tuesday at 8:45 AM.

## 2024-01-27 ENCOUNTER — Ambulatory Visit: Admitting: Occupational Therapy

## 2024-01-27 DIAGNOSIS — R29898 Other symptoms and signs involving the musculoskeletal system: Secondary | ICD-10-CM

## 2024-01-27 DIAGNOSIS — M79644 Pain in right finger(s): Secondary | ICD-10-CM

## 2024-01-27 DIAGNOSIS — R278 Other lack of coordination: Secondary | ICD-10-CM

## 2024-01-27 DIAGNOSIS — M6281 Muscle weakness (generalized): Secondary | ICD-10-CM | POA: Diagnosis not present

## 2024-01-27 DIAGNOSIS — M25641 Stiffness of right hand, not elsewhere classified: Secondary | ICD-10-CM

## 2024-01-27 NOTE — Therapy (Signed)
 OUTPATIENT OCCUPATIONAL THERAPY ORTHO TREATMENT  Patient Name: Charles Atkinson MRN: [id number] DOB:1995-05-12, 28 y.o., male Today's Date: 01/27/2024  PCP: No PCP REFERRING PROVIDER: Arlinda Buster, MD  END OF SESSION:  OT End of Session - 01/27/24 0855     Visit Number 2    Number of Visits 16    Date for Recertification  03/26/24    Authorization Type [employer]et[employer]/ [facility] Medicaid 2025 Covered 100%    Authorization Time Period VL: 60 NO AUTH REQUIRED    OT Start Time 0855    OT Stop Time 0933    OT Time Calculation (min) 38 min    Equipment Utilized During Treatment Fluidotherapy    Activity Tolerance Patient tolerated treatment well    Behavior During Therapy WFL for tasks assessed/performed          No past medical history on file. Past Surgical History:  Procedure Laterality Date   HAND SURGERY Right    There are no active problems to display for this patient.   ONSET DATE: 12/31/2023; 2.5 - 3 months, 11/02/23 onset  REFERRING DIAG: M79.644 (ICD-10-CM) - Thumb pain, right Dx per MRI: partial tear of the ulnar collateral ligament   THERAPY DIAG:  Stiffness of finger joint, right  Muscle weakness (generalized)  Other lack of coordination  Pain of right thumb  Other symptoms and signs involving the musculoskeletal system  Rationale for Evaluation and Treatment: Rehabilitation  SUBJECTIVE:   SUBJECTIVE STATEMENT: Pt goes to the doctor this week.  He reports that he hasn't been wearing his splint at home but protects it at work.   Pt accompanied by: self  PERTINENT HISTORY: PMHx: N/A except for R hand fracture with pins when he was a teenager > 10 years ago  Underwent MRI of the right hand which showed partial tear of the ulnar collateral ligament without findings to suggest complete tear or rupture. No Stener lesion. Charles Atkinson   PRECAUTIONS: Other: no lifting or straining R hand Per MD: Refrain from strengthening at this time.   RED FLAGS: None   WEIGHT BEARING  RESTRICTIONS: Yes RUE limitations  PAIN: Only hurts when he moves it the wrong way or bumps it.  Are you having pain? Yes: NPRS scale: 8/10 at worst; fine at rest Pain location: base of thumb Pain description: sharp pains Aggravating factors: moving it a certain way (outside of splint) Relieving factors: splint and over the counter Advil   FALLS: Has patient fallen in last 6 months? 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  HAND DOMINANCE: Right  ADLs: WFL  FUNCTIONAL OUTCOME MEASURES: Quick Dash: 38.6  UPPER EXTREMITY ROM:     Active ROM Right eval Left eval  Shoulder flexion    Shoulder abduction    Shoulder adduction    Shoulder extension    Shoulder internal rotation    Shoulder external rotation    Elbow flexion    Elbow extension    Wrist flexion 70 80  Wrist extension 64 64  Wrist ulnar deviation    Wrist radial deviation    Wrist pronation  Wrist supination    (Blank rows = not tested)  Active ROM Right eval 01/27/24 Left eval  Thumb MCP (0-60) 27 44/46 64  Thumb IP (0-80) 60 76/76 80  Thumb Radial abd/add (0-55)      Thumb Palmar abd/add (0-45)      Thumb Opposition to Small Finger Stiff  Ease   Index MCP (0-90)      Index PIP (0-100)      Index DIP (0-70)       Long MCP (0-90)       Long PIP (0-100)       Long DIP (0-70)       Ring MCP (0-90)       Ring PIP (0-100)       Ring DIP (0-70)       Little MCP (0-90)       Little PIP (0-100)       Little DIP (0-70)       (Blank rows = not tested)   UPPER  EXTREMITY MMT:     MMT Right eval Left eval  Shoulder flexion 5 5  Shoulder abduction    Shoulder adduction    Shoulder extension    Shoulder internal rotation    Shoulder external rotation    Middle trapezius    Lower trapezius    Elbow flexion 5 5  Elbow extension 5 5  Wrist flexion    Wrist extension    Wrist ulnar deviation    Wrist radial deviation    Wrist pronation    Wrist supination    (Blank rows = not tested)  HAND FUNCTION: Grip strength: Right: 8.5, 9.9, 11.6 lbs; Left: 64.3, 63.9, 70.9 lbs Eval 01/20/24 Average Right: 10.0 lbs; Left 66.4 lbs   COORDINATION: 9 Hole Peg test: Right: 25.32 sec; Left: 30.04 sec  SENSATION: WFL  EDEMA: little bit around thumb  COGNITION: Overall cognitive status: Within functional limits for tasks assessed  OBSERVATIONS: Pt ambulates with no AE and no balance deficits. Pt arrived with thumb spica Exos applied to RUE.  Evaluation was on his birthday and pt is well kept but with limited mobility and strength in dominant RUE.   TODAY'S TREATMENT:                                                                                                                            - Therapeutic exercises completed for duration as noted below including:  Pt noted to have improved AROM of R thumb today compared to eval ie) + 15* for MCP and IP flexion, with at least 2* improvement for each s/p fluidotherapy.   Pt placed RUE in Fluidotherapy machine with supervised ROM x 12 min. Pt was educated to complete AROM and gentle stretches of R thumb during modality time to improve ROM and decrease pain/stiffness of affected extremity by use of the machine's massaging action and thermal properties.  Pt provided instructions re: Heat program to help with  ROM activities.   Pt engaged in thumb exercises with review of motions from [place]  Protocol previously issued at eval.  Pt encouraged to move within his own muscle power and then add gentle stretch before  full passive stretching can occur.  ROM included thumb flexion/extension, adduction/abduction and opposition with addition of thumb slides to base of fingers.  Pt easily achieves finger opposition to all fingers today including pinkie.  He was engage din wrist prayer stretch with gentle stretch to thumb ie) with and without thumbs touching.    PATIENT EDUCATION: Education details: Thumb ROM Person educated: Patient Education method: Explanation, Demonstration, Tactile cues, and Verbal cues Education comprehension: verbalized understanding, returned demonstration, verbal cues required, tactile cues required, and needs further education  HOME EXERCISE PROGRAM: 01/20/24: Tendon glides and Thumb ROM ([place]  Protocol) 01/27/24: Heat Program   GOALS: Goals reviewed with patient? Yes    SHORT TERM GOALS: Target date: 02/20/24   Patient will demonstrate initial RUE HEP with 25% verbal cues or less for proper execution. Baseline: New to outpt OT - splint/cast since injury in July 2025 Goal status: IN Progress - Tendon Gliding Exc issued at eval.    2. Patient will increase R thumb MCP/IP flexion for full tight composite fist to grasp objects without dropping items.   Baseline:   Right Left  Thumb MCP (0-60) 27 64  Thumb IP (0-80) 60 80   Goal status: IN Progress - HEP initiated at eval  3. Patient will demonstrate at least 30 lb improvement in dominant RUE grip strength as needed to open jars and other containers. Baseline: Right: 10.0 lbs; Left 66.4 lbs Goal status: IN Progress   LONG TERM GOALS: Target date: 03/26/24   Patient will demonstrate updated RUE HEP with visual instruction only for proper execution. Baseline: New to outpt OT Goal status: IN Progress - Tendon Gliding Exc issued at eval.    2.  Pt will be independent with home based pain management routine to potentially include gloves/splints, heat and joint protection principles for minimal pain <3/10 with  movements. Baseline: 8/10 with movements Goal status: IN Progress  3. Pt will be able to tolerate force/impact to R hand with minimal difficulty and without substantial increase in pain with appropriate support as needed for handling packages at work, resuming sports etc.  Baseline: Unable/100% impaired per QuickDash  Goal status: INITIAL   4.  Patient will demonstrate at least 16% improvement with quick Dash score (reporting <20% disability or less) indicating improved functional use of affected extremity.  Baseline: QuickDash 38.6% Goal status: INITIAL   ASSESSMENT:  CLINICAL IMPRESSION: Patient is a 29 y.o. male who was seen today for occupational therapy treatment for R thumb pain/sprain. Patient presented today with increased ROM of R thumb but without strengthening activities at this time per MD recommendation - therefore focused on continued ROM activities. Pt would benefit from continued skilled OT services in the outpatient setting to work on impairments as noted below to help pt return to PLOF as able.    PERFORMANCE DEFICITS: in functional skills including IADLs, coordination, ROM, strength, pain, fascial restrictions, muscle spasms, flexibility, Fine motor control, Gross motor control, body mechanics, endurance, decreased knowledge of precautions, and UE functional use.  IMPAIRMENTS: are limiting patient from IADLs, work, leisure, and social participation.   COMORBIDITIES: has no other co-morbidities that affects occupational performance. Patient will benefit from skilled OT to address above impairments and improve overall function.  REHAB POTENTIAL: Excellent  PLAN:  OT FREQUENCY:  1-2x/week  OT DURATION: 8 weeks  PLANNED INTERVENTIONS: 97168 OT Re-evaluation, 97535 self care/ADL training, 02889 therapeutic exercise, 97530 therapeutic activity, 97140 manual therapy, 97035 ultrasound, 97018 paraffin, 02960 fluidotherapy, 97010 moist heat, 97010 cryotherapy, 97034 contrast  bath, 97032 electrical stimulation (manual), 97760 Orthotic Initial, 97763 Orthotic/Prosthetic subsequent, passive range of motion, coping strategies training, patient/family education, and DME and/or AE instructions  RECOMMENDED OTHER SERVICES: NA  CONSULTED AND AGREED WITH PLAN OF CARE: Patient  PLAN FOR NEXT SESSION:  Fluidotherapy HEP progression Strengthening when MD approves  Clarita LITTIE Pride, OT 01/27/2024, 9:36 AM

## 2024-01-27 NOTE — Patient Instructions (Signed)

## 2024-01-29 ENCOUNTER — Ambulatory Visit: Admitting: Orthopedic Surgery

## 2024-02-02 ENCOUNTER — Ambulatory Visit: Attending: Orthopedic Surgery | Admitting: Occupational Therapy

## 2024-02-04 ENCOUNTER — Ambulatory Visit: Admitting: Occupational Therapy

## 2024-02-09 ENCOUNTER — Encounter: Admitting: Occupational Therapy

## 2024-02-09 ENCOUNTER — Telehealth: Payer: Self-pay | Admitting: Occupational Therapy

## 2024-02-09 NOTE — Telephone Encounter (Signed)
 This is to document my attempt to call patient after no-show for OT appt this AM.  This is patient's 4th missed appt.   Primary and Cell phone number(s) was used in efforts to contact the patient.   Voice mail was left requesting the patient call the clinic back at [phone] to schedule an upcoming appointment if he wants to continue OT before his plan of care expires in November.  However at this time, all subsequent appts were cancelled and he would need to be scheduled one visit at a time due to our clinic attendance policy and multiple No-Show appts in a row without telephone contact from pt.

## 2024-02-12 ENCOUNTER — Encounter: Admitting: Occupational Therapy

## 2024-02-16 ENCOUNTER — Encounter: Admitting: Occupational Therapy

## 2024-02-19 ENCOUNTER — Encounter: Admitting: Occupational Therapy

## 2024-03-01 ENCOUNTER — Encounter: Payer: Self-pay | Admitting: Radiology

## 2024-04-13 ENCOUNTER — Encounter (HOSPITAL_COMMUNITY): Payer: Self-pay

## 2024-04-13 ENCOUNTER — Ambulatory Visit (HOSPITAL_COMMUNITY): Admission: EM | Admit: 2024-04-13 | Discharge: 2024-04-13 | Disposition: A | Discharge status: Home / Self Care

## 2024-04-13 DIAGNOSIS — R051 Acute cough: Secondary | ICD-10-CM

## 2024-04-13 LAB — POC COVID19/FLU A&B COMBO
Covid Antigen, POC: NEGATIVE
Influenza A Antigen, POC: NEGATIVE
Influenza B Antigen, POC: NEGATIVE

## 2024-04-13 NOTE — ED Triage Notes (Signed)
 Pt has c/o nasal congestion and headache that started yesterday. Has been taking ibuprofen  for the headaches. Pt states,my family member has the flu, and I want to be sure I don't have it.

## 2024-04-13 NOTE — ED Provider Notes (Signed)
 [facility]    CSN: [id number] Arrival date & time: 04/13/24  1752      History   Chief Complaint Chief Complaint  Patient presents with   Nasal Congestion    HPI Charles Atkinson is a 28 y.o. male.   This 28 year old male is being seen for acute onset of congestion, headache, and cough.  Symptom onset was last night.  He reports family members tested positive for flu.  He has taken ibuprofen  with minimal relief of symptoms.  He denies dizziness, vision changes.  He denies ear pain, discharge, sore throat.  He denies chest pain, shortness of breath.  He denies abdominal pain, nausea, vomiting, diarrhea.     History reviewed. No pertinent past medical history.  There are no active problems to display for this patient.   Past Surgical History:  Procedure Laterality Date   HAND SURGERY Right        Home Medications    Prior to Admission medications  Not on File    Family History History reviewed. No pertinent family history.  Social History Social History[1]   Allergies   Patient has no known allergies.   Review of Systems Review of Systems  Constitutional:  Positive for activity change and appetite change. Negative for chills and fever.  HENT:  Positive for congestion. Negative for ear pain, sinus pressure, sinus pain, sore throat and voice change.   Eyes:  Negative for pain and visual disturbance.  Respiratory:  Positive for cough. Negative for shortness of breath.   Cardiovascular:  Negative for chest pain.  Gastrointestinal:  Negative for abdominal pain, diarrhea, nausea and vomiting.  Genitourinary:  Negative for difficulty urinating, dysuria, frequency and urgency.  Musculoskeletal:  Negative for arthralgias and myalgias.  Skin:  Negative for color change and rash.  Neurological:  Positive for headaches. Negative for dizziness.     Physical Exam Triage Vital Signs ED Triage Vitals [04/13/24 1918]  Encounter Vitals Group     BP  (!) 141/80     Girls Systolic BP Percentile      Girls Diastolic BP Percentile      Boys Systolic BP Percentile      Boys Diastolic BP Percentile      Pulse Rate 69     Resp 18     Temp 97.7 F (36.5 C)     Temp Source Oral     SpO2 98 %     Weight      Height      Head Circumference      Peak Flow      Pain Score      Pain Loc      Pain Education      Exclude from Growth Chart    No data found.  Updated Vital Signs BP (!) 141/80 (BP Location: Right Arm)   Pulse 69   Temp 97.7 F (36.5 C) (Oral)   Resp 18   SpO2 98%   Visual Acuity Right Eye Distance:   Left Eye Distance:   Bilateral Distance:    Right Eye Near:   Left Eye Near:    Bilateral Near:     Physical Exam Vitals and nursing note reviewed.  Constitutional:      General: He is awake. He is not in acute distress.    Appearance: He is well-developed. He is not ill-appearing or toxic-appearing.     Comments: Pleasant male appearing stated age found sitting in chair in no acute distress.  HENT:     Head: Normocephalic and atraumatic.     Right Ear: Tympanic membrane and external ear normal.     Left Ear: Tympanic membrane and external ear normal.     Nose: Congestion present.     Right Turbinates: Enlarged.     Left Turbinates: Enlarged.     Mouth/Throat:     Lips: Pink.     Mouth: Mucous membranes are moist.     Pharynx: No pharyngeal swelling or oropharyngeal exudate.  Eyes:     Conjunctiva/sclera: Conjunctivae normal.  Cardiovascular:     Rate and Rhythm: Normal rate and regular rhythm.     Heart sounds: Normal heart sounds. No murmur heard. Pulmonary:     Effort: Pulmonary effort is normal. No respiratory distress.     Breath sounds: Normal breath sounds.  Abdominal:     General: Bowel sounds are normal.     Palpations: Abdomen is soft.     Tenderness: There is no abdominal tenderness.  Skin:    General: Skin is warm and dry.     Capillary Refill: Capillary refill takes less than 2  seconds.  Neurological:     Mental Status: He is alert.  Psychiatric:        Mood and Affect: Mood normal.        Behavior: Behavior is cooperative.      UC Treatments / Results  Labs (all labs ordered are listed, but only abnormal results are displayed) Labs Reviewed  POC COVID19/FLU A&B COMBO    EKG   Radiology No results found.  Procedures Procedures (including critical care time)  Medications Ordered in UC Medications - No data to display  Initial Impression / Assessment and Plan / UC Course  I have reviewed the triage vital signs and the nursing notes.  Pertinent labs & imaging results that were available during my care of the patient were reviewed by me and considered in my medical decision making (see chart for details).     Vitals and triage reviewed, patient is hemodynamically stable.  COVID/flu swab obtained and is negative.  Suspect viral respiratory infection.  Encouraged rest, increased fluids.  Tylenol , guaifenesin, saline nasal spray as needed for symptom management.  Plan of care, follow-up care, return precautions given, no questions at this time Final Clinical Impressions(s) / UC Diagnoses   Final diagnoses:  Acute cough   Discharge Instructions   None    ED Prescriptions   None    PDMP not reviewed this encounter.    [1]  Social History Tobacco Use   Smoking status: Every Day    Types: Cigars   Smokeless tobacco: Never  Vaping Use   Vaping status: Never Used  Substance Use Topics   Alcohol use: Not Currently    Alcohol/week: 5.0 standard drinks of alcohol    Types: 5 Cans of beer per week   Drug use: No     Lennice Jon BROCKS, FNP 04/13/24 2024

## 2024-04-13 NOTE — Discharge Instructions (Addendum)
